# Patient Record
Sex: Male | Born: 1980 | Race: White | Hispanic: No | Marital: Single | Smoking: Current every day smoker
Health system: Southern US, Community
[De-identification: ages and names within clinical notes are randomized; demographics above are authoritative.]

## PROBLEM LIST (undated history)

## (undated) DIAGNOSIS — R112 Nausea with vomiting, unspecified: Secondary | ICD-10-CM

## (undated) DIAGNOSIS — T8859XA Other complications of anesthesia, initial encounter: Secondary | ICD-10-CM

## (undated) DIAGNOSIS — S069X9A Unspecified intracranial injury with loss of consciousness of unspecified duration, initial encounter: Secondary | ICD-10-CM

## (undated) DIAGNOSIS — G4730 Sleep apnea, unspecified: Secondary | ICD-10-CM

## (undated) DIAGNOSIS — F419 Anxiety disorder, unspecified: Secondary | ICD-10-CM

## (undated) DIAGNOSIS — Z9889 Other specified postprocedural states: Secondary | ICD-10-CM

## (undated) DIAGNOSIS — J449 Chronic obstructive pulmonary disease, unspecified: Secondary | ICD-10-CM

## (undated) DIAGNOSIS — Z87442 Personal history of urinary calculi: Secondary | ICD-10-CM

## (undated) DIAGNOSIS — F32A Depression, unspecified: Secondary | ICD-10-CM

## (undated) DIAGNOSIS — M1990 Unspecified osteoarthritis, unspecified site: Secondary | ICD-10-CM

## (undated) DIAGNOSIS — F329 Major depressive disorder, single episode, unspecified: Secondary | ICD-10-CM

## (undated) DIAGNOSIS — T4145XA Adverse effect of unspecified anesthetic, initial encounter: Secondary | ICD-10-CM

## (undated) HISTORY — PX: CARPAL TUNNEL RELEASE: SHX101

## (undated) HISTORY — PX: MULTIPLE TOOTH EXTRACTIONS: SHX2053

---
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
## 2014-04-09 ENCOUNTER — Emergency Department: Payer: Self-pay | Admitting: Emergency Medicine

## 2015-02-08 ENCOUNTER — Other Ambulatory Visit: Payer: Self-pay | Admitting: Neurological Surgery

## 2015-02-13 ENCOUNTER — Encounter (HOSPITAL_COMMUNITY): Payer: Self-pay | Admitting: *Deleted

## 2015-02-13 ENCOUNTER — Encounter (HOSPITAL_COMMUNITY)
Admission: RE | Admit: 2015-02-13 | Discharge: 2015-02-13 | Disposition: A | Discharge status: Home / Self Care | Payer: Medicaid Other | Source: Ambulatory Visit | Attending: Anesthesiology | Admitting: Anesthesiology

## 2015-02-13 ENCOUNTER — Encounter (HOSPITAL_COMMUNITY): Payer: Self-pay

## 2015-02-13 ENCOUNTER — Encounter (HOSPITAL_COMMUNITY)
Admission: RE | Admit: 2015-02-13 | Discharge: 2015-02-13 | Disposition: A | Discharge status: Home / Self Care | Payer: Medicaid Other | Source: Ambulatory Visit | Attending: Neurological Surgery | Admitting: Neurological Surgery

## 2015-02-13 DIAGNOSIS — J449 Chronic obstructive pulmonary disease, unspecified: Secondary | ICD-10-CM | POA: Insufficient documentation

## 2015-02-13 DIAGNOSIS — F17200 Nicotine dependence, unspecified, uncomplicated: Secondary | ICD-10-CM | POA: Diagnosis not present

## 2015-02-13 DIAGNOSIS — Z01812 Encounter for preprocedural laboratory examination: Secondary | ICD-10-CM | POA: Diagnosis not present

## 2015-02-13 DIAGNOSIS — M5020 Other cervical disc displacement, unspecified cervical region: Secondary | ICD-10-CM | POA: Diagnosis not present

## 2015-02-13 DIAGNOSIS — Z01818 Encounter for other preprocedural examination: Secondary | ICD-10-CM | POA: Insufficient documentation

## 2015-02-13 DIAGNOSIS — R05 Cough: Secondary | ICD-10-CM | POA: Diagnosis not present

## 2015-02-13 DIAGNOSIS — R059 Cough, unspecified: Secondary | ICD-10-CM

## 2015-02-13 HISTORY — DX: Chronic obstructive pulmonary disease, unspecified: J44.9

## 2015-02-13 HISTORY — DX: Unspecified intracranial injury with loss of consciousness of unspecified duration, initial encounter: S06.9X9A

## 2015-02-13 HISTORY — DX: Personal history of urinary calculi: Z87.442

## 2015-02-13 HISTORY — DX: Anxiety disorder, unspecified: F41.9

## 2015-02-13 LAB — CBC
HEMATOCRIT: 47.2 % (ref 39.0–52.0)
HEMOGLOBIN: 16.1 g/dL (ref 13.0–17.0)
MCH: 31.3 pg (ref 26.0–34.0)
MCHC: 34.1 g/dL (ref 30.0–36.0)
MCV: 91.8 fL (ref 78.0–100.0)
Platelets: 274 10*3/uL (ref 150–400)
RBC: 5.14 MIL/uL (ref 4.22–5.81)
RDW: 13.3 % (ref 11.5–15.5)
WBC: 10.7 10*3/uL — ABNORMAL HIGH (ref 4.0–10.5)

## 2015-02-13 LAB — SURGICAL PCR SCREEN
MRSA, PCR: NEGATIVE
STAPHYLOCOCCUS AUREUS: NEGATIVE

## 2015-02-13 MED ORDER — CEFAZOLIN SODIUM-DEXTROSE 2-3 GM-% IV SOLR
2.0000 g | INTRAVENOUS | Status: AC
  Administered 2015-02-14: 2 g via INTRAVENOUS
  Filled 2015-02-13: qty 50

## 2015-02-13 NOTE — Progress Notes (Addendum)
Taylor Coffey denies chest pain or shortness of breath.  Patient has a cough, has a history of COPD, is suppose to have an in haler and CPAP but has neither.  Patient's PCP is Dr  In Rosalita Levan.  Patient reported that  He had sleep study at [facility]. I requested sleep study from [facility].  I Dr Len Childs office and left a message for them to call and give their fax number or fax  office note , labs and chest x-rays to our fax number. Taylor Coffey has just completed a course of antibiotics due to a cat bite on face, skin is healed.  Patient reported that he thinks he told Dr Bevely Palmer.

## 2015-02-13 NOTE — Pre-Procedure Instructions (Addendum)
    Avrohom Mckelvin  02/13/2015    Your procedure is scheduled on : Wednesday, January 25.  Report to [facility] Admitting at 6:30A.M.              Your surgery or procedure is scheduled for 8:30 AM   Call this number if you have problems the morning of surgery:[phone]                 Remember:  Do not eat food or drink liquids after midnight.  Take these medicines the morning of surgery with A SIP OF WATER: gabapentin (NEURONTIN).                 STOP taking Meloxicam (Movic).   Do not wear jewelry, make-up or nail polish.  Do not wear lotions, powders, or perfumes.               Men may shave face and neck.  Do not bring valuables to the hospital.  [facility] is not responsible for any belongings or valuables.  Contacts, dentures or bridgework may not be worn into surgery.  Leave your suitcase in the car.  After surgery it may be brought to your room.  For patients admitted to the hospital, discharge time will be determined by your treatment team.  Patients discharged the day of surgery will not be allowed to drive home.   Name and phone number of your driver:   -  Special instructions:  Review  [facility] - Preparing For Surgery.  Please read over the following fact sheets that you were given. Pain Booklet, Coughing and Deep Breathing, MRSA Information and Surgical Site Infection Prevention

## 2015-02-14 ENCOUNTER — Inpatient Hospital Stay (HOSPITAL_COMMUNITY): Payer: Medicaid Other | Admitting: Certified Registered Nurse Anesthetist

## 2015-02-14 ENCOUNTER — Ambulatory Visit (HOSPITAL_COMMUNITY)
Admission: RE | Admit: 2015-02-14 | Discharge: 2015-02-15 | Disposition: A | Discharge status: Home / Self Care | Payer: Medicaid Other | Source: Ambulatory Visit | Attending: Neurological Surgery | Admitting: Neurological Surgery

## 2015-02-14 ENCOUNTER — Encounter (HOSPITAL_COMMUNITY): Payer: Self-pay | Admitting: Certified Registered Nurse Anesthetist

## 2015-02-14 ENCOUNTER — Ambulatory Visit (HOSPITAL_COMMUNITY): Payer: Medicaid Other

## 2015-02-14 ENCOUNTER — Encounter (HOSPITAL_COMMUNITY)
Admission: RE | Disposition: A | Discharge status: Home / Self Care | Payer: Self-pay | Source: Ambulatory Visit | Attending: Neurological Surgery

## 2015-02-14 DIAGNOSIS — J449 Chronic obstructive pulmonary disease, unspecified: Secondary | ICD-10-CM | POA: Diagnosis not present

## 2015-02-14 DIAGNOSIS — F419 Anxiety disorder, unspecified: Secondary | ICD-10-CM | POA: Diagnosis not present

## 2015-02-14 DIAGNOSIS — Z885 Allergy status to narcotic agent status: Secondary | ICD-10-CM | POA: Insufficient documentation

## 2015-02-14 DIAGNOSIS — F17210 Nicotine dependence, cigarettes, uncomplicated: Secondary | ICD-10-CM | POA: Diagnosis not present

## 2015-02-14 DIAGNOSIS — Z880 Allergy status to penicillin: Secondary | ICD-10-CM | POA: Diagnosis not present

## 2015-02-14 DIAGNOSIS — Z419 Encounter for procedure for purposes other than remedying health state, unspecified: Secondary | ICD-10-CM

## 2015-02-14 DIAGNOSIS — M4722 Other spondylosis with radiculopathy, cervical region: Secondary | ICD-10-CM | POA: Insufficient documentation

## 2015-02-14 DIAGNOSIS — Z87442 Personal history of urinary calculi: Secondary | ICD-10-CM | POA: Insufficient documentation

## 2015-02-14 DIAGNOSIS — M50123 Cervical disc disorder at C6-C7 level with radiculopathy: Secondary | ICD-10-CM | POA: Diagnosis not present

## 2015-02-14 HISTORY — DX: Nausea with vomiting, unspecified: R11.2

## 2015-02-14 HISTORY — DX: Adverse effect of unspecified anesthetic, initial encounter: T41.45XA

## 2015-02-14 HISTORY — PX: ANTERIOR CERVICAL DECOMP/DISCECTOMY FUSION: SHX1161

## 2015-02-14 HISTORY — DX: Other complications of anesthesia, initial encounter: T88.59XA

## 2015-02-14 HISTORY — DX: Other specified postprocedural states: Z98.890

## 2015-02-14 SURGERY — ANTERIOR CERVICAL DECOMPRESSION/DISCECTOMY FUSION 2 LEVELS
Anesthesia: General

## 2015-02-14 MED ORDER — LIDOCAINE-EPINEPHRINE 2 %-1:100000 IJ SOLN
INTRAMUSCULAR | Status: DC | PRN
  Administered 2015-02-14: 10 mL via INTRADERMAL

## 2015-02-14 MED ORDER — BISACODYL 5 MG PO TBEC: 5.0000 mg | DELAYED_RELEASE_TABLET | Freq: Every day | ORAL | Status: DC | PRN

## 2015-02-14 MED ORDER — DEXAMETHASONE SODIUM PHOSPHATE 10 MG/ML IJ SOLN
INTRAMUSCULAR | Status: DC | PRN
  Administered 2015-02-14: 10 mg via INTRAVENOUS

## 2015-02-14 MED ORDER — SODIUM CHLORIDE 0.9% FLUSH: 3.0000 mL | INTRAVENOUS | Status: DC | PRN

## 2015-02-14 MED ORDER — ROCURONIUM BROMIDE 100 MG/10ML IV SOLN
INTRAVENOUS | Status: DC | PRN
  Administered 2015-02-14: 50 mg via INTRAVENOUS

## 2015-02-14 MED ORDER — METHOCARBAMOL 500 MG PO TABS
500.0000 mg | ORAL_TABLET | Freq: Four times a day (QID) | ORAL | Status: DC | PRN
  Administered 2015-02-14: 500 mg via ORAL
  Filled 2015-02-14: qty 1

## 2015-02-14 MED ORDER — FLEET ENEMA 7-19 GM/118ML RE ENEM: 1.0000 | ENEMA | Freq: Once | RECTAL | Status: DC | PRN

## 2015-02-14 MED ORDER — NICOTINE 21 MG/24HR TD PT24
21.0000 mg | MEDICATED_PATCH | Freq: Every day | TRANSDERMAL | Status: DC
  Administered 2015-02-14 – 2015-02-15 (×2): 21 mg via TRANSDERMAL
  Filled 2015-02-14 (×2): qty 1

## 2015-02-14 MED ORDER — DOXYCYCLINE HYCLATE 100 MG PO TABS
100.0000 mg | ORAL_TABLET | Freq: Two times a day (BID) | ORAL | Status: DC
  Filled 2015-02-14: qty 1

## 2015-02-14 MED ORDER — ZOLPIDEM TARTRATE 5 MG PO TABS: 5.0000 mg | ORAL_TABLET | Freq: Every evening | ORAL | Status: DC | PRN

## 2015-02-14 MED ORDER — DEXAMETHASONE SODIUM PHOSPHATE 10 MG/ML IJ SOLN
INTRAMUSCULAR | Status: AC
  Filled 2015-02-14: qty 1

## 2015-02-14 MED ORDER — ACETAMINOPHEN 325 MG PO TABS
650.0000 mg | ORAL_TABLET | ORAL | Status: DC | PRN
  Filled 2015-02-14: qty 2

## 2015-02-14 MED ORDER — SODIUM CHLORIDE 0.9 % IJ SOLN
INTRAMUSCULAR | Status: AC
  Filled 2015-02-14: qty 10

## 2015-02-14 MED ORDER — HYDROMORPHONE HCL 1 MG/ML IJ SOLN: 0.5000 mg | INTRAMUSCULAR | Status: DC | PRN

## 2015-02-14 MED ORDER — PROPOFOL 10 MG/ML IV BOLUS
INTRAVENOUS | Status: AC
  Filled 2015-02-14: qty 40

## 2015-02-14 MED ORDER — OXYCODONE-ACETAMINOPHEN 5-325 MG PO TABS
1.0000 | ORAL_TABLET | ORAL | Status: DC | PRN
  Administered 2015-02-14 – 2015-02-15 (×5): 2 via ORAL
  Filled 2015-02-14 (×5): qty 2

## 2015-02-14 MED ORDER — ONDANSETRON HCL 4 MG/2ML IJ SOLN: 4.0000 mg | INTRAMUSCULAR | Status: DC | PRN

## 2015-02-14 MED ORDER — THROMBIN 5000 UNITS EX SOLR
CUTANEOUS | Status: DC | PRN
  Administered 2015-02-14 (×3): 5000 [IU] via TOPICAL

## 2015-02-14 MED ORDER — PHENOL 1.4 % MT LIQD: 1.0000 | OROMUCOSAL | Status: DC | PRN

## 2015-02-14 MED ORDER — LACTATED RINGERS IV SOLN
INTRAVENOUS | Status: DC | PRN
  Administered 2015-02-14: 08:00:00 via INTRAVENOUS

## 2015-02-14 MED ORDER — SENNA 8.6 MG PO TABS
1.0000 | ORAL_TABLET | Freq: Two times a day (BID) | ORAL | Status: DC
  Administered 2015-02-14 – 2015-02-15 (×3): 8.6 mg via ORAL
  Filled 2015-02-14 (×3): qty 1

## 2015-02-14 MED ORDER — GABAPENTIN 600 MG PO TABS
600.0000 mg | ORAL_TABLET | Freq: Four times a day (QID) | ORAL | Status: DC
  Administered 2015-02-14 – 2015-02-15 (×3): 600 mg via ORAL
  Filled 2015-02-14 (×3): qty 1

## 2015-02-14 MED ORDER — BACITRACIN 50000 UNITS IM SOLR
INTRAMUSCULAR | Status: DC | PRN
  Administered 2015-02-14: 08:00:00

## 2015-02-14 MED ORDER — FENTANYL CITRATE (PF) 250 MCG/5ML IJ SOLN
INTRAMUSCULAR | Status: AC
  Filled 2015-02-14: qty 5

## 2015-02-14 MED ORDER — SODIUM CHLORIDE 0.9 % IV SOLN: 250.0000 mL | INTRAVENOUS | Status: DC

## 2015-02-14 MED ORDER — SODIUM CHLORIDE 0.9 % IV SOLN
INTRAVENOUS | Status: DC
Start: 2015-02-14 — End: 2015-02-15

## 2015-02-14 MED ORDER — ONDANSETRON HCL 4 MG/2ML IJ SOLN
INTRAMUSCULAR | Status: DC | PRN
  Administered 2015-02-14: 4 mg via INTRAVENOUS

## 2015-02-14 MED ORDER — GLYCOPYRROLATE 0.2 MG/ML IJ SOLN
INTRAMUSCULAR | Status: DC | PRN
  Administered 2015-02-14: 0.4 mg via INTRAVENOUS

## 2015-02-14 MED ORDER — CEFAZOLIN SODIUM 1-5 GM-% IV SOLN
1.0000 g | Freq: Three times a day (TID) | INTRAVENOUS | Status: AC
  Administered 2015-02-14 – 2015-02-15 (×2): 1 g via INTRAVENOUS
  Filled 2015-02-14 (×2): qty 50

## 2015-02-14 MED ORDER — PHENYLEPHRINE 40 MCG/ML (10ML) SYRINGE FOR IV PUSH (FOR BLOOD PRESSURE SUPPORT)
PREFILLED_SYRINGE | INTRAVENOUS | Status: AC
  Filled 2015-02-14: qty 10

## 2015-02-14 MED ORDER — LIDOCAINE HCL (CARDIAC) 20 MG/ML IV SOLN
INTRAVENOUS | Status: DC | PRN
  Administered 2015-02-14: 100 mg via INTRAVENOUS

## 2015-02-14 MED ORDER — EPHEDRINE SULFATE 50 MG/ML IJ SOLN
INTRAMUSCULAR | Status: AC
  Filled 2015-02-14: qty 1

## 2015-02-14 MED ORDER — THROMBIN 5000 UNITS EX SOLR
CUTANEOUS | Status: DC | PRN
  Administered 2015-02-14: 08:00:00 via TOPICAL

## 2015-02-14 MED ORDER — ROCURONIUM BROMIDE 50 MG/5ML IV SOLN
INTRAVENOUS | Status: AC
  Filled 2015-02-14: qty 1

## 2015-02-14 MED ORDER — HYDROMORPHONE HCL 1 MG/ML IJ SOLN
0.2500 mg | INTRAMUSCULAR | Status: DC | PRN
  Administered 2015-02-14: 0.5 mg via INTRAVENOUS

## 2015-02-14 MED ORDER — DEXTROSE 5 % IV SOLN
500.0000 mg | Freq: Four times a day (QID) | INTRAVENOUS | Status: DC | PRN
  Filled 2015-02-14: qty 5

## 2015-02-14 MED ORDER — BUPIVACAINE-EPINEPHRINE (PF) 0.5% -1:200000 IJ SOLN
INTRAMUSCULAR | Status: DC | PRN
Start: 2015-02-14 — End: 2015-02-14
  Administered 2015-02-14: 10 mL via PERINEURAL

## 2015-02-14 MED ORDER — PROPOFOL 10 MG/ML IV BOLUS
INTRAVENOUS | Status: DC | PRN
  Administered 2015-02-14: 200 mg via INTRAVENOUS

## 2015-02-14 MED ORDER — MIDAZOLAM HCL 2 MG/2ML IJ SOLN
INTRAMUSCULAR | Status: AC
  Filled 2015-02-14: qty 2

## 2015-02-14 MED ORDER — FENTANYL CITRATE (PF) 100 MCG/2ML IJ SOLN
INTRAMUSCULAR | Status: DC | PRN
Start: 2015-02-14 — End: 2015-02-14
  Administered 2015-02-14: 50 ug via INTRAVENOUS
  Administered 2015-02-14: 100 ug via INTRAVENOUS
  Administered 2015-02-14 (×10): 50 ug via INTRAVENOUS
  Administered 2015-02-14: 100 ug via INTRAVENOUS

## 2015-02-14 MED ORDER — SODIUM CHLORIDE 0.9% FLUSH
3.0000 mL | Freq: Two times a day (BID) | INTRAVENOUS | Status: DC
  Administered 2015-02-14: 3 mL via INTRAVENOUS

## 2015-02-14 MED ORDER — LIDOCAINE HCL (CARDIAC) 20 MG/ML IV SOLN
INTRAVENOUS | Status: AC
  Filled 2015-02-14: qty 5

## 2015-02-14 MED ORDER — DOCUSATE SODIUM 100 MG PO CAPS
100.0000 mg | ORAL_CAPSULE | Freq: Two times a day (BID) | ORAL | Status: DC
  Administered 2015-02-14 – 2015-02-15 (×3): 100 mg via ORAL
  Filled 2015-02-14 (×3): qty 1

## 2015-02-14 MED ORDER — MENTHOL 3 MG MT LOZG: 1.0000 | LOZENGE | OROMUCOSAL | Status: DC | PRN

## 2015-02-14 MED ORDER — HYDROMORPHONE HCL 1 MG/ML IJ SOLN
INTRAMUSCULAR | Status: AC
  Filled 2015-02-14: qty 1

## 2015-02-14 MED ORDER — PROMETHAZINE HCL 25 MG/ML IJ SOLN
6.2500 mg | INTRAMUSCULAR | Status: DC | PRN
Start: 2015-02-14 — End: 2015-02-14

## 2015-02-14 MED ORDER — NEOSTIGMINE METHYLSULFATE 10 MG/10ML IV SOLN
INTRAVENOUS | Status: DC | PRN
  Administered 2015-02-14: 3 mg via INTRAVENOUS

## 2015-02-14 MED ORDER — HEMOSTATIC AGENTS (NO CHARGE) OPTIME
TOPICAL | Status: DC | PRN
  Administered 2015-02-14: 1 via TOPICAL

## 2015-02-14 MED ORDER — ACETAMINOPHEN 650 MG RE SUPP: 650.0000 mg | RECTAL | Status: DC | PRN

## 2015-02-14 MED ORDER — MEPERIDINE HCL 25 MG/ML IJ SOLN: 6.2500 mg | INTRAMUSCULAR | Status: DC | PRN

## 2015-02-14 MED ORDER — SUCCINYLCHOLINE CHLORIDE 20 MG/ML IJ SOLN
INTRAMUSCULAR | Status: AC
  Filled 2015-02-14: qty 1

## 2015-02-14 MED ORDER — ONDANSETRON HCL 4 MG/2ML IJ SOLN
INTRAMUSCULAR | Status: AC
  Filled 2015-02-14: qty 2

## 2015-02-14 MED ORDER — 0.9 % SODIUM CHLORIDE (POUR BTL) OPTIME
TOPICAL | Status: DC | PRN
  Administered 2015-02-14: 1000 mL

## 2015-02-14 MED ORDER — MIDAZOLAM HCL 5 MG/5ML IJ SOLN
INTRAMUSCULAR | Status: DC | PRN
  Administered 2015-02-14: 2 mg via INTRAVENOUS

## 2015-02-14 SURGICAL SUPPLY — 68 items
APPLICATOR CHLORAPREP 3ML ORNG (MISCELLANEOUS) ×3 IMPLANT
BIT DRILL NEURO 2X3.1 SFT TUCH (MISCELLANEOUS) ×1 IMPLANT
BIT DRILL POWER (BIT) ×1 IMPLANT
BLADE SURG 11 STRL SS (BLADE) ×3 IMPLANT
BLADE ULTRA TIP 2M (BLADE) IMPLANT
BONE MATRIX OSTEOCEL PRO SM (Bone Implant) ×3 IMPLANT
BUR MATCHSTICK NEURO 3.0 LAGG (BURR) ×3 IMPLANT
CAGE SMALL 7X13X15 (Cage) ×6 IMPLANT
CANISTER SUCT 3000ML PPV (MISCELLANEOUS) ×3 IMPLANT
DECANTER SPIKE VIAL GLASS SM (MISCELLANEOUS) ×3 IMPLANT
DERMABOND ADVANCED (GAUZE/BANDAGES/DRESSINGS) ×2
DERMABOND ADVANCED .7 DNX12 (GAUZE/BANDAGES/DRESSINGS) ×1 IMPLANT
DRAPE C-ARM 42X72 X-RAY (DRAPES) ×3 IMPLANT
DRAPE C-ARMOR (DRAPES) ×6 IMPLANT
DRAPE LAPAROTOMY 100X72 PEDS (DRAPES) ×3 IMPLANT
DRAPE MICROSCOPE LEICA (MISCELLANEOUS) ×3 IMPLANT
DRAPE POUCH INSTRU U-SHP 10X18 (DRAPES) ×3 IMPLANT
DRAPE PROXIMA HALF (DRAPES) IMPLANT
DRAPE SHEET LG 3/4 BI-LAMINATE (DRAPES) ×3 IMPLANT
DRILL BIT POWER (BIT) ×2
DRILL NEURO 2X3.1 SOFT TOUCH (MISCELLANEOUS) ×3
ELECT COATED BLADE 2.86 ST (ELECTRODE) ×3 IMPLANT
ELECT REM PT RETURN 9FT ADLT (ELECTROSURGICAL) ×3
ELECTRODE REM PT RTRN 9FT ADLT (ELECTROSURGICAL) ×1 IMPLANT
EVACUATOR 1/8 PVC DRAIN (DRAIN) IMPLANT
GAUZE SPONGE 4X4 12PLY STRL (GAUZE/BANDAGES/DRESSINGS) ×3 IMPLANT
GAUZE SPONGE 4X4 16PLY XRAY LF (GAUZE/BANDAGES/DRESSINGS) IMPLANT
GLOVE BIO SURGEON STRL SZ7 (GLOVE) ×9 IMPLANT
GLOVE BIO SURGEON STRL SZ8 (GLOVE) ×3 IMPLANT
GLOVE BIOGEL PI IND STRL 7.5 (GLOVE) ×1 IMPLANT
GLOVE BIOGEL PI INDICATOR 7.5 (GLOVE) ×2
GLOVE EXAM NITRILE LRG STRL (GLOVE) IMPLANT
GLOVE INDICATOR 7.0 STRL GRN (GLOVE) ×12 IMPLANT
GLOVE INDICATOR 7.5 STRL GRN (GLOVE) ×6 IMPLANT
GLOVE SS BIOGEL STRL SZ 7 (GLOVE) ×3 IMPLANT
GLOVE SUPERSENSE BIOGEL SZ 7 (GLOVE) ×6
GLOVE SURG SS PI 6.5 STRL IVOR (GLOVE) ×9 IMPLANT
GOWN STRL REUS W/ TWL LRG LVL3 (GOWN DISPOSABLE) ×4 IMPLANT
GOWN STRL REUS W/ TWL XL LVL3 (GOWN DISPOSABLE) ×1 IMPLANT
GOWN STRL REUS W/TWL LRG LVL3 (GOWN DISPOSABLE) ×8
GOWN STRL REUS W/TWL XL LVL3 (GOWN DISPOSABLE) ×2
HEMOSTAT POWDER KIT SURGIFOAM (HEMOSTASIS) ×3 IMPLANT
KIT BASIN OR (CUSTOM PROCEDURE TRAY) ×3 IMPLANT
KIT ROOM TURNOVER OR (KITS) ×3 IMPLANT
NEEDLE HYPO 25X1 1.5 SAFETY (NEEDLE) ×3 IMPLANT
NEEDLE SPNL 20GX3.5 QUINCKE YW (NEEDLE) ×3 IMPLANT
NEEDLE SPNL 22GX3.5 QUINCKE BK (NEEDLE) ×3 IMPLANT
NS IRRIG 1000ML POUR BTL (IV SOLUTION) ×3 IMPLANT
PACK LAMINECTOMY NEURO (CUSTOM PROCEDURE TRAY) ×3 IMPLANT
PAD ARMBOARD 7.5X6 YLW CONV (MISCELLANEOUS) ×3 IMPLANT
PATTIES SURGICAL .5X1.5 (GAUZE/BANDAGES/DRESSINGS) ×3 IMPLANT
PLATE ARCHON 42MM 2LVL (Plate) ×3 IMPLANT
RUBBERBAND STERILE (MISCELLANEOUS) ×6 IMPLANT
SCREW ARCHON SELFTAP 4.0X13 (Screw) ×6 IMPLANT
SCREW ARCHON SELFTAP 4.0X15MM (Screw) ×6 IMPLANT
SCREW ARCHON ST VAR 4.0X15MM (Screw) ×4 IMPLANT
SCREW BN 15X4XST VA NS SPN (Screw) ×2 IMPLANT
SPONGE INTESTINAL PEANUT (DISPOSABLE) ×6 IMPLANT
SPONGE SURGIFOAM ABS GEL SZ50 (HEMOSTASIS) ×3 IMPLANT
STAPLER VISISTAT 35W (STAPLE) ×3 IMPLANT
STOCKINETTE 6  STRL (DRAPES) ×2
STOCKINETTE 6 STRL (DRAPES) ×1 IMPLANT
SUT VIC AB 3-0 SH 8-18 (SUTURE) ×3 IMPLANT
TOWEL OR 17X24 6PK STRL BLUE (TOWEL DISPOSABLE) ×6 IMPLANT
TOWEL OR 17X26 10 PK STRL BLUE (TOWEL DISPOSABLE) ×3 IMPLANT
TUBE CONNECTING 12'X1/4 (SUCTIONS)
TUBE CONNECTING 12X1/4 (SUCTIONS) IMPLANT
WATER STERILE IRR 1000ML POUR (IV SOLUTION) ×3 IMPLANT

## 2015-02-14 NOTE — Op Note (Signed)
02/14/2015  11:50 AM  PATIENT:  Taylor Coffey  35 y.o. male  PRE-OPERATIVE DIAGNOSIS:  Cervical spondylosis with radiculopathy, C5-6, C6-7, herniated nucleus pulposis C6-7  POST-OPERATIVE DIAGNOSIS:  Same  PROCEDURE:  C5-6, C6-7 ACDF  SURGEON:  Hulan Saas, MD  ASSISTANTS: Marikay Alar, MD  ANESTHESIA:   General  DRAINS: Medium Hemovac  SPECIMEN:  None  INDICATION FOR PROCEDURE: 35 year old man with medically refractory neck and arm pain with cervical spondylosis C5-6, C6-7. Patient understood the risks, benefits, and alternatives and potential outcomes and wished to proceed.  PROCEDURE DETAILS: Patient was brought to the operating room placed under general endotracheal anesthesia. Patient was placed in the supine position on the operating room table. The neck was prepped with betadine and chloraprep and draped in a sterile fashion.   A transverse incision was made on the right side of the neck. Dissection was carried down thru the subcutaneous tissue and the platysma was elevated, opened vertically, and undermined with Metzenbaum scissors. Dissection was then carried out thru an avascular plane leaving the sternocleidomastoid, carotid artery, and jugular vein laterally and the trachea and esophagus medially. The ventral aspect of the vertebral column was identified and a localizing x-ray was taken. The C5-6 level was identified. The longus colli muscles were then elevated and the retractor was placed. The annulus was incised and the disc space entered. Discectomy was performed with micro-curettes and pituitary and kerrison rongeurs. I then used the high-speed drill to drill the endplates down to the level of the posterior longitudinal ligament. The operating microscope was draped and brought into the field provided additional magnification, illumination and visualization. Utilizing microsurgical technique, discectomy was continued posteriorly thru the disc space. Posterior  longitudinal ligament was opened with a nerve hook, and then removed along with disc herniation and osteophytes, decompressing the spinal canal and thecal sac. We then continued to remove osteophytic overgrowth and disc material decompressing the neural foramina and exiting nerve roots bilaterally. So by both visualization and palpation we felt we had an adequate decompression of the neural elements. We then measured the height of the intravertebral disc space and selected a 6 millimeter Peek interbody cage packed with morcellized allograft. It was then gently positioned in the intravertebral disc space and countersunk.   The distraction pin was removed from the C5 vertebral body and bone bleeding was stopped with bone wax. The distraction pin was inserted at the level of C7. The annulus was incised and the disc space entered. Discectomy was performed with micro-curettes and pituitary and kerrison rongeurs. I then used the high-speed drill to drill the endplates down to the level of the posterior longitudinal ligament. The operating microscope was draped and brought into the field provided additional magnification, illumination and visualization. Utilizing microsurgical technique, discectomy was continued posteriorly thru the disc space. Posterior longitudinal ligament was opened with a nerve hook, and then removed along with disc herniation and osteophytes, decompressing the spinal canal and thecal sac. We then continued to remove osteophytic overgrowth and disc material decompressing the neural foramina and exiting nerve roots bilaterally. So by both visualization and palpation we felt we had an adequate decompression of the neural elements. We then measured the height of the intravertebral disc space and selected a 8 millimeter Peek interbody cage packed with morcellized allograft. It was then gently positioned in the intravertebral disc space and countersunk.   I then used a 40 mm plate and placed four  variable angle screws into the C5 and C6  vertebral bodies and two fixed angle screws at C7 and locked them into position. The wound was irrigated with bacitracin solution, checked for hemostasis which was established and confirmed. Once meticulous hemostasis was achieved, we then proceeded with closure. The platysma was closed with interrupted 3-0 undyed Vicryl suture, the subcuticular layer was closed with interrupted 3-0 undyed Vicryl suture. The skin edges were approximated with dermabond. The drapes were removed. A sterile dressing was applied. The patient was then awakened from general anesthesia and transferred to the recovery room in stable condition. At the end of the procedure all sponge, needle and instrument counts were correct.  PATIENT DISPOSITION:  PACU then floor   Delay start of Pharmacological VTE agent (>24hrs) due to surgical blood loss or risk of bleeding:  Yes

## 2015-02-14 NOTE — Transfer of Care (Signed)
Immediate Anesthesia Transfer of Care Note  Patient: Taylor Coffey  Procedure(s) Performed: Procedure(s): Cervical five-six cervical six-seven  Anterior cervical decompression/diskectomy/fusion (N/A)  Patient Location: PACU  Anesthesia Type:General  Level of Consciousness: awake, alert , oriented and patient cooperative  Airway & Oxygen Therapy: Patient Spontanous Breathing and Patient connected to nasal cannula oxygen  Post-op Assessment: Report given to RN and Post -op Vital signs reviewed and stable  Post vital signs: Reviewed and stable  Last Vitals:  Filed Vitals:   02/14/15 0712  BP: 116/75  Pulse: 74  Temp: 36.2 C  Resp: 18    Complications: No apparent anesthesia complications

## 2015-02-14 NOTE — Anesthesia Postprocedure Evaluation (Signed)
Anesthesia Post Note  Patient: Taylor Coffey  Procedure(s) Performed: Procedure(s) (LRB): Cervical five-six cervical six-seven  Anterior cervical decompression/diskectomy/fusion (N/A)  Patient location during evaluation: PACU Anesthesia Type: General Level of consciousness: sedated and patient cooperative Pain management: pain level controlled Vital Signs Assessment: post-procedure vital signs reviewed and stable Respiratory status: spontaneous breathing Cardiovascular status: stable Anesthetic complications: no    Last Vitals:  Filed Vitals:   02/14/15 1356 02/14/15 1400  BP:  132/74  Pulse: 100 103  Temp:    Resp: 10 15    Last Pain:  Filed Vitals:   02/14/15 1404  PainSc: Asleep                 Lewie Loron

## 2015-02-14 NOTE — Anesthesia Procedure Notes (Signed)
Procedure Name: Intubation Date/Time: 02/14/2015 8:46 AM Performed by: Faustino Congress Dodge Ator Pre-anesthesia Checklist: Patient identified, Emergency Drugs available, Suction available and Patient being monitored Patient Re-evaluated:Patient Re-evaluated prior to inductionOxygen Delivery Method: Circle system utilized Preoxygenation: Pre-oxygenation with 100% oxygen Intubation Type: IV induction Ventilation: Mask ventilation without difficulty Laryngoscope Size: Mac and 3 Grade View: Grade I Tube type: Oral Tube size: 8.0 mm Number of attempts: 1 Airway Equipment and Method: Stylet Placement Confirmation: ETT inserted through vocal cords under direct vision,  positive ETCO2 and breath sounds checked- equal and bilateral Secured at: 23 cm Tube secured with: Tape Dental Injury: Teeth and Oropharynx as per pre-operative assessment

## 2015-02-14 NOTE — Progress Notes (Signed)
Sleeping now Has been moving all extremities well Incision c/d/i and flat To floor

## 2015-02-14 NOTE — H&P (Signed)
CC:  Neck and arm pain refractory to medical management  HPI: Taylor Coffey is a 35 y.o. male with neck and right arm pain refractory to medical management.  He presents today for a C5-6, 6-7 ACDF.  We have discussed the risks and benefits and he wishes to proceed.  PMH: Past Medical History  Diagnosis Date  . Complication of anesthesia 2011ish    Woke up with headache, and nausea  . PONV (postoperative nausea and vomiting)     nausea  . Head injury with loss of consciousness (HCC)   . COPD (chronic obstructive pulmonary disease) (HCC)   . Anxiety     with crowds of people  . History of kidney stones     PSH: Past Surgical History  Procedure Laterality Date  . Multiple tooth extractions      SH: Social History  Substance Use Topics  . Smoking status: Current Every Day Smoker -- 1.50 packs/day for 20 years  . Smokeless tobacco: None  . Alcohol Use: No    MEDS: Prior to Admission medications   Medication Sig Start Date End Date Taking? Authorizing Provider  doxycycline (VIBRA-TABS) 100 MG tablet Take 100 mg by mouth 2 (two) times daily.   Yes Historical Provider, MD  gabapentin (NEURONTIN) 600 MG tablet Take 600 mg by mouth 4 (four) times daily. 12/22/14  Yes Historical Provider, MD  HYDROcodone-acetaminophen (NORCO) 7.5-325 MG tablet Take 1 tablet by mouth every 6 (six) hours as needed for moderate pain (Pat takes 2 in the am and 2 at [facility]   Yes Historical Provider, MD  meloxicam (MOBIC) 7.5 MG tablet Take 7.5 mg by mouth daily. 01/24/15  Yes Historical Provider, MD    ALLERGY: Allergies  Allergen Reactions  . Vicodin [Hydrocodone-Acetaminophen] Itching  . Amoxicillin Itching and Rash    ROS: ROS  NEUROLOGIC EXAM: Awake, alert, oriented Memory and concentration grossly intact Speech fluent, appropriate CN grossly intact Motor exam: Upper Extremities Deltoid Bicep Tricep Grip  Right 5/5 4/5 4/5 5/5  Left 5/5 5/5 5/5 5/5   Lower Extremity IP Quad PF DF  EHL  Right 5/5 5/5 5/5 5/5 5/5  Left 5/5 5/5 5/5 5/5 5/5   Sensation grossly intact to LT  IMAGING: No new imaging  IMPRESSION: - 35 y.o. male with cervical spondylosis with radiculopathy.  Deficits as above.  PLAN: - C5-6, 6-7 ACDF

## 2015-02-14 NOTE — Anesthesia Preprocedure Evaluation (Addendum)
Anesthesia Evaluation  Patient identified by MRN, date of birth, ID band Patient awake    Reviewed: Allergy & Precautions, NPO status , Patient's Chart, lab work & pertinent test results  History of Anesthesia Complications (+) PONV and history of anesthetic complications  Airway Mallampati: II  TM Distance: >3 FB Neck ROM: Full    Dental no notable dental hx. (+) Dental Advisory Given   Pulmonary COPD, Current Smoker,    Pulmonary exam normal breath sounds clear to auscultation       Cardiovascular negative cardio ROS Normal cardiovascular exam Rhythm:Regular Rate:Normal     Neuro/Psych Anxiety negative neurological ROS  negative psych ROS   GI/Hepatic negative GI ROS, Neg liver ROS,   Endo/Other  negative endocrine ROS  Renal/GU negative Renal ROS     Musculoskeletal negative musculoskeletal ROS (+)   Abdominal   Peds  Hematology negative hematology ROS (+)   Anesthesia Other Findings   Reproductive/Obstetrics                            Anesthesia Physical Anesthesia Plan  ASA: III  Anesthesia Plan: General   Post-op Pain Management:    Induction: Intravenous  Airway Management Planned: Oral ETT  Additional Equipment:   Intra-op Plan:   Post-operative Plan: Extubation in OR  Informed Consent: I have reviewed the patients History and Physical, chart, labs and discussed the procedure including the risks, benefits and alternatives for the proposed anesthesia with the patient or authorized representative who has indicated his/her understanding and acceptance.   Dental advisory given  Plan Discussed with: CRNA  Anesthesia Plan Comments:         Anesthesia Quick Evaluation

## 2015-02-15 ENCOUNTER — Encounter (HOSPITAL_COMMUNITY): Payer: Self-pay | Admitting: Neurological Surgery

## 2015-02-15 DIAGNOSIS — M50123 Cervical disc disorder at C6-C7 level with radiculopathy: Secondary | ICD-10-CM | POA: Diagnosis not present

## 2015-02-15 MED ORDER — METHOCARBAMOL 750 MG PO TABS: 750.0000 mg | ORAL_TABLET | Freq: Four times a day (QID) | ORAL | Status: DC

## 2015-02-15 MED ORDER — OXYCODONE-ACETAMINOPHEN 5-325 MG PO TABS: 1.0000 | ORAL_TABLET | ORAL | Status: DC | PRN

## 2015-02-15 NOTE — Progress Notes (Signed)
Pt seen and examined. No issues overnight.  EXAM: Temp:  [97.6 F (36.4 C)-98.1 F (36.7 C)] 98 F (36.7 C) (01/26 0841) Pulse Rate:  [74-122] 74 (01/26 0841) Resp:  [10-20] 20 (01/26 0841) BP: (112-142)/(52-88) 112/85 mmHg (01/26 0841) SpO2:  [93 %-99 %] 99 % (01/26 0841) Intake/Output      01/25 0701 - 01/26 0700 01/26 0701 - 01/27 0700   P.O. 360    I.V. 1300    Total Intake 1660     Urine 0    Drains 35    Blood 75    Total Output 110     Net +1550          Urine Occurrence 3 x     Awake and alert Follows commands throughout Full strength  Stable D/c

## 2015-02-15 NOTE — Progress Notes (Signed)
Pt doing well. Pt and friend given D/C instructions with Rx, verbal understanding was provided. Pt's incision is clean and dry with no sign of infection. Pt's IV and Hemovac were removed prior to D/C. Pt D/C'd home via walking @ 0950 per MD order. Pt is stable @ D/C and has no other needs at this time. Rema Fendt, RN

## 2015-02-15 NOTE — Discharge Summary (Signed)
Expand All Collapse All   Date of admission: 02/14/15  Date of discharge: 02/15/15  Admission diagnosis: Cervical spondylosis with radiculopathy  Discharge diagnosis: Same  Procedure performed: C5-6 and C6-7 ACDF with plating  Hospital course: Taylor Coffey was admitted to the hospital morning of surgery and taken the operating room for the above listed operation. He tolerated this well was stable and discharged from the operating room to the PACU and then to the floor. On the morning of postoperative day 1 he was doing well he was ambulating independently tolerating regular diet and his pain was well-controlled. He is discharged this time stable medical neurological condition.  Discharge medications: He will resume his prior medications  Follow-up: With me in two weeks

## 2015-02-19 ENCOUNTER — Encounter: Payer: Medicaid Other | Admitting: Neurology

## 2015-03-21 ENCOUNTER — Encounter: Payer: Medicaid Other | Admitting: Neurology

## 2015-03-21 ENCOUNTER — Telehealth: Payer: Self-pay | Admitting: Neurology

## 2015-03-21 NOTE — Telephone Encounter (Signed)
This patient did not show for an EMG and nerve conduction study appointment today. 

## 2015-03-22 ENCOUNTER — Encounter: Payer: Self-pay | Admitting: Neurology

## 2016-02-27 ENCOUNTER — Encounter: Payer: Self-pay | Admitting: *Deleted

## 2016-02-27 ENCOUNTER — Ambulatory Visit: Payer: Medicaid Other | Attending: Neurological Surgery | Admitting: *Deleted

## 2016-02-27 DIAGNOSIS — G5603 Carpal tunnel syndrome, bilateral upper limbs: Secondary | ICD-10-CM | POA: Diagnosis present

## 2016-02-27 DIAGNOSIS — M79641 Pain in right hand: Secondary | ICD-10-CM | POA: Insufficient documentation

## 2016-02-27 DIAGNOSIS — M79642 Pain in left hand: Secondary | ICD-10-CM | POA: Insufficient documentation

## 2016-02-27 NOTE — Therapy (Signed)
[facility] [address] [place], [place], [address] Phone: [phone]   Fax:  [phone]  Occupational Therapy Evaluation  Patient Details  Name: Taylor Coffey MRN: [id number] Date of Birth: 01/10/1981 Referring Provider: Hulan Saas, MD  Encounter Date: 02/27/2016      OT End of Session - 02/27/16 1150    Visit Number 1   Number of Visits 1   Authorization Type MCD Eval only   Authorization - Visit Number 1   Authorization - Number of Visits 1   OT Start Time [phone]   OT Stop Time 1006   OT Time Calculation (min) 44 min   Activity Tolerance Patient tolerated treatment well;No increased pain   Behavior During Therapy WFL for tasks assessed/performed      Past Medical History:  Diagnosis Date  . Anxiety    with crowds of people  . Complication of anesthesia 2011ish   Woke up with headache, and nausea  . COPD (chronic obstructive pulmonary disease) (HCC)   . Head injury with loss of consciousness (HCC)   . History of kidney stones   . PONV (postoperative nausea and vomiting)    nausea    Past Surgical History:  Procedure Laterality Date  . ANTERIOR CERVICAL DECOMP/DISCECTOMY FUSION N/A 02/14/2015   Procedure: Cervical five-six cervical six-seven  Anterior cervical decompression/diskectomy/fusion;  Surgeon: Loura Halt Ditty, MD;  Location: [facility];  Service: Neurosurgery;  Laterality: N/A;  . MULTIPLE TOOTH EXTRACTIONS      There were no vitals filed for this visit.      Subjective Assessment - 02/27/16 0927    Subjective  Pt states that he had Left CTR on 01/02/16, he also has CTS R hand and may need surgery in the near future - possibly Feb. 2018.    Patient is accompained by: Family member  Significant other   Repetition Increases Symptoms  Works on [employer] - gets worse after working   Patient Stated Goals Dercreased pain in left hand and digits 1-3.    Currently in Pain? 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  No   Is the patient reluctant to leave their home because of a fear of falling?  No     Home  Environment   Family/patient expects to be discharged to: Private residence   Lives With Significant other     Prior Function   Level of Independence Independent   Vocation Unemployed   Vocation Requirements Works on [employer], cars - currently  not able to work much due to symptoms and pain. Difficulty holding onto tools and use/complete jobs.  Only works when able - [employer] since 2015     Written Expression   Dominant Hand Right     Activity Tolerance   Activity Tolerance Comments Decreased ability to perform work related tasks due to dropping tools and taking longer to complete jobs.     Cognition   Overall Cognitive Status Within Functional Limits for tasks assessed     Observation/Other Assessments   Other Surveys  Select   Quick DASH  50     Sensation   Light Touch Appears Intact   Additional Comments Intact to light touch bilateral hands, digits 1-5 noted.     Coordination   9 Hole Peg Test Right;Left   Right 9 Hole Peg Test 27.91sec   Left 9 Hole Peg Test 25.50 sec   Other Pt is with noted scar  hypersensitivity along left volar wrist s/p CTR on 01/02/16. He reports that sensitivity is getting better over time. He was educated in desensitization techniques and scar management/massage. Pt was positive Tinels along carpal tunnel and Phalens testing for provocative testing on R & L. Symptoms on right were greater than left. "I think that left is getting better"      Edema   Edema --  None observed visually when comparing R & L hands     Hand Function   Right Hand Grip (lbs) 76#   Right Hand Lateral Pinch 17 lbs   Right Hand 3 Point Pinch 18 lbs   Left Hand Grip (lbs) 66#   Left Hand Lateral Pinch 19 lbs   Left 3 point pinch 22.5 lbs     Sensation Exercises   Desensitization Pt is sensitive along left wrist volar scar - he was instructed in home program for densesitization techniques.                  OT Treatments/Exercises (OP) - 02/27/16 0001      ADLs   Overall ADLs Pt was educated in neutral wrist techniques for dialy activities. He was educated to avoid sustained grip and repetetive motion. Exampes were provided and neutral wrist was demonstrated in clinic today.      Exercises   Exercises Hand     Hand Exercises   Tendon Glides Pt was instructed in gentle tendon gliding bilateral hands   Other Hand Exercises Pt was instructed in median nerve gliding techniques bilateral UE's. He performed in clinis after instruction/demonstration. Handout issued.   Other Hand Exercises Pt was eduated to perform forearm flexor stretches bilateral UE's, first with elbow flexed and then extended as tolerated.     Splinting   Splinting Pt was issued pre-fab wrist cock-up splints bilaterally and instructed to only wear at noc as he reports waking up at night with CTS symptoms, paresthesias etc, he also states that he tends to sleep with his wrists tucked/flexed. Splinting use, care and precautions were all reviewed with pt/significant other.                OT Education -  02/27/16 1148    Education provided Yes   Education Details Neutral wrist positioning for functional activity. Avoidance of repetetive motion and sustained grip. Tendon gliding, median nerve gliding ex's, forearm flexor stretches (with elbow flexed then extended as tolerated), scar management left volar wrist. Pre-fab splint use at noc only.   Person(s) Educated Patient;Spouse   Methods Explanation;Demonstration;Verbal cues;Handout  Comprehension Verbalized understanding;Returned demonstration          OT Short Term Goals - 02/27/16 1159      OT SHORT TERM GOAL #1   Title Eval only: Pt verbalized understanding of home program and noc pre-fab splint use, care and precautions in clinic today. He denies further OT needs at this time.   Status Achieved           OT Long Term Goals - 02/27/16 1200      OT LONG TERM GOAL #1   Title Eval only               Plan - 02/27/16 1151    Clinical Impression Statement Pt is a 36 y/o male with dx: Bilateral CTS, he underwent CTR left on 01/02/16 and cont to experience some symptoms of scar hypersensitivity and paresthesias at times. He was instructed in a home program of conservative management for CTS R UE, noc splinting PRN bilaterally, desensitization of left scar volar palm & neutral wrist for functional activity. He verbalized understanding of this treatment plan and home program and will cont independently. He should benefit from f/u with his MD PRN.   Rehab Potential --  Eval only   OT Frequency One time visit   Plan Eval only   Consulted and Agree with Plan of Care Patient;Family member/caregiver   Family Member Consulted Significant other/spouse      Patient will benefit from skilled therapeutic intervention in order to improve the following deficits and impairments:     Visit Diagnosis: Pain in right hand - Plan: Ot plan of care cert/re-cert  Pain in left hand - Plan: Ot plan of care cert/re-cert  Carpal tunnel  syndrome, bilateral upper limbs - Plan: Ot plan of care cert/re-cert    Problem List Patient Active Problem List   Diagnosis Date Noted  . Cervical spondylosis with radiculopathy 02/14/2015    Mariam Dollar Dionicio Stall, OTR/L 02/27/2016, 12:06 PM  [facility] [address] [place], [place], [address] Phone: [phone]   Fax:  [phone]  Name: Tina Temme MRN: [id number] Date of Birth: Jan 24, 1980

## 2016-08-19 ENCOUNTER — Other Ambulatory Visit: Payer: Self-pay | Admitting: Physician Assistant

## 2016-08-19 DIAGNOSIS — M5412 Radiculopathy, cervical region: Secondary | ICD-10-CM

## 2016-09-02 ENCOUNTER — Ambulatory Visit
Admission: RE | Admit: 2016-09-02 | Discharge: 2016-09-02 | Disposition: A | Discharge status: Home / Self Care | Payer: Medicaid Other | Source: Ambulatory Visit | Attending: Physician Assistant | Admitting: Physician Assistant

## 2016-09-02 DIAGNOSIS — M5412 Radiculopathy, cervical region: Secondary | ICD-10-CM

## 2016-09-02 MED ORDER — GADOBENATE DIMEGLUMINE 529 MG/ML IV SOLN
20.0000 mL | Freq: Once | INTRAVENOUS | Status: AC | PRN
  Administered 2016-09-02: 20 mL via INTRAVENOUS

## 2017-03-30 DIAGNOSIS — F17200 Nicotine dependence, unspecified, uncomplicated: Secondary | ICD-10-CM | POA: Diagnosis not present

## 2017-03-30 DIAGNOSIS — Z0000 Encounter for general adult medical examination without abnormal findings: Secondary | ICD-10-CM | POA: Diagnosis not present

## 2017-03-30 DIAGNOSIS — Z6836 Body mass index (BMI) 36.0-36.9, adult: Secondary | ICD-10-CM | POA: Diagnosis not present

## 2017-03-30 DIAGNOSIS — E669 Obesity, unspecified: Secondary | ICD-10-CM | POA: Diagnosis not present

## 2017-05-19 DIAGNOSIS — M542 Cervicalgia: Secondary | ICD-10-CM | POA: Diagnosis not present

## 2017-05-19 DIAGNOSIS — R200 Anesthesia of skin: Secondary | ICD-10-CM | POA: Diagnosis not present

## 2017-05-19 DIAGNOSIS — M25511 Pain in right shoulder: Secondary | ICD-10-CM | POA: Diagnosis not present

## 2017-05-19 DIAGNOSIS — Z6837 Body mass index (BMI) 37.0-37.9, adult: Secondary | ICD-10-CM | POA: Diagnosis not present

## 2017-08-15 DIAGNOSIS — F17210 Nicotine dependence, cigarettes, uncomplicated: Secondary | ICD-10-CM | POA: Diagnosis not present

## 2017-08-15 DIAGNOSIS — Z981 Arthrodesis status: Secondary | ICD-10-CM | POA: Diagnosis not present

## 2017-08-15 DIAGNOSIS — M542 Cervicalgia: Secondary | ICD-10-CM | POA: Diagnosis not present

## 2017-08-15 DIAGNOSIS — G4733 Obstructive sleep apnea (adult) (pediatric): Secondary | ICD-10-CM | POA: Diagnosis not present

## 2017-08-15 DIAGNOSIS — G8929 Other chronic pain: Secondary | ICD-10-CM | POA: Diagnosis not present

## 2017-09-22 DIAGNOSIS — J449 Chronic obstructive pulmonary disease, unspecified: Secondary | ICD-10-CM | POA: Diagnosis not present

## 2017-09-22 DIAGNOSIS — R599 Enlarged lymph nodes, unspecified: Secondary | ICD-10-CM | POA: Diagnosis not present

## 2017-12-28 DIAGNOSIS — M549 Dorsalgia, unspecified: Secondary | ICD-10-CM | POA: Diagnosis not present

## 2017-12-28 DIAGNOSIS — G4733 Obstructive sleep apnea (adult) (pediatric): Secondary | ICD-10-CM | POA: Diagnosis not present

## 2017-12-28 DIAGNOSIS — G8929 Other chronic pain: Secondary | ICD-10-CM | POA: Diagnosis not present

## 2017-12-28 DIAGNOSIS — M542 Cervicalgia: Secondary | ICD-10-CM | POA: Diagnosis not present

## 2017-12-28 DIAGNOSIS — M5020 Other cervical disc displacement, unspecified cervical region: Secondary | ICD-10-CM | POA: Diagnosis not present

## 2017-12-28 DIAGNOSIS — Z981 Arthrodesis status: Secondary | ICD-10-CM | POA: Diagnosis not present

## 2017-12-28 DIAGNOSIS — R413 Other amnesia: Secondary | ICD-10-CM | POA: Diagnosis not present

## 2017-12-28 DIAGNOSIS — M5412 Radiculopathy, cervical region: Secondary | ICD-10-CM | POA: Diagnosis not present

## 2017-12-28 DIAGNOSIS — G4700 Insomnia, unspecified: Secondary | ICD-10-CM | POA: Diagnosis not present

## 2017-12-28 DIAGNOSIS — M25511 Pain in right shoulder: Secondary | ICD-10-CM | POA: Diagnosis not present

## 2018-04-01 DIAGNOSIS — J449 Chronic obstructive pulmonary disease, unspecified: Secondary | ICD-10-CM | POA: Diagnosis not present

## 2018-04-01 DIAGNOSIS — E782 Mixed hyperlipidemia: Secondary | ICD-10-CM | POA: Diagnosis not present

## 2018-04-01 DIAGNOSIS — Z125 Encounter for screening for malignant neoplasm of prostate: Secondary | ICD-10-CM | POA: Diagnosis not present

## 2018-04-01 DIAGNOSIS — Z0000 Encounter for general adult medical examination without abnormal findings: Secondary | ICD-10-CM | POA: Diagnosis not present

## 2018-04-01 DIAGNOSIS — F17200 Nicotine dependence, unspecified, uncomplicated: Secondary | ICD-10-CM | POA: Diagnosis not present

## 2018-04-01 DIAGNOSIS — R5383 Other fatigue: Secondary | ICD-10-CM | POA: Diagnosis not present

## 2018-04-01 DIAGNOSIS — E669 Obesity, unspecified: Secondary | ICD-10-CM | POA: Diagnosis not present

## 2018-04-01 DIAGNOSIS — Z6837 Body mass index (BMI) 37.0-37.9, adult: Secondary | ICD-10-CM | POA: Diagnosis not present

## 2018-12-30 DIAGNOSIS — Z2821 Immunization not carried out because of patient refusal: Secondary | ICD-10-CM | POA: Diagnosis not present

## 2018-12-30 DIAGNOSIS — G4709 Other insomnia: Secondary | ICD-10-CM | POA: Diagnosis not present

## 2018-12-30 DIAGNOSIS — F341 Dysthymic disorder: Secondary | ICD-10-CM | POA: Diagnosis not present

## 2018-12-30 DIAGNOSIS — E782 Mixed hyperlipidemia: Secondary | ICD-10-CM | POA: Diagnosis not present

## 2019-01-27 DIAGNOSIS — M4722 Other spondylosis with radiculopathy, cervical region: Secondary | ICD-10-CM | POA: Diagnosis not present

## 2019-01-27 DIAGNOSIS — M5412 Radiculopathy, cervical region: Secondary | ICD-10-CM | POA: Diagnosis not present

## 2019-02-01 DIAGNOSIS — M542 Cervicalgia: Secondary | ICD-10-CM | POA: Diagnosis not present

## 2019-02-01 DIAGNOSIS — M5412 Radiculopathy, cervical region: Secondary | ICD-10-CM | POA: Diagnosis not present

## 2019-02-09 DIAGNOSIS — M4712 Other spondylosis with myelopathy, cervical region: Secondary | ICD-10-CM | POA: Diagnosis not present

## 2019-02-09 DIAGNOSIS — M4802 Spinal stenosis, cervical region: Secondary | ICD-10-CM | POA: Diagnosis not present

## 2019-02-15 ENCOUNTER — Other Ambulatory Visit: Payer: Self-pay | Admitting: Neurosurgery

## 2019-02-25 ENCOUNTER — Other Ambulatory Visit: Payer: Self-pay | Admitting: Neurosurgery

## 2019-03-02 NOTE — Pre-Procedure Instructions (Signed)
Peyton Spengler  03/02/2019      [id number] - [place] Cao, [place] - [address] AT [facility] [address] [place] [address] Phone: [phone] Fax: [phone]    Your procedure is scheduled on Feb. 15  Report to [facility] entrance A at 7:15 A.M.  Call this number if you have problems the morning of surgery:  [phone]   Remember:  Do not eat or drink after midnight.      Take these medicines the morning of surgery with A SIP OF WATER :              Duloxetine (cymbalta)             Gabapentin (neurontin)             7 days prior to surgery STOP taking any Aspirin (unless otherwise instructed by your surgeon), Aleve, Naproxen, Ibuprofen, Motrin, Advil, Goody's, BC's, all herbal medications, fish oil, and all vitamins.    Do not wear jewelry.  Do not wear lotions, powders, or perfumes, or deodorant.  Do not shave 48 hours prior to surgery.  Men may shave face and neck.  Do not bring valuables to the hospital.  [facility] is not responsible for any belongings or valuables.  Contacts, dentures or bridgework may not be worn into surgery.  Leave your suitcase in the car.  After surgery it may be brought to your room.  For patients admitted to the hospital, discharge time will be determined by your treatment team.  Patients discharged the day of surgery will not be allowed to drive home.    Special instructions:  [facility]- Preparing For Surgery  Before surgery, you can play an important role. Because skin is not sterile, your skin needs to be as free of germs as possible. You can reduce the number of germs on your skin by washing with CHG (chlorahexidine gluconate) Soap before surgery.  CHG is an antiseptic cleaner which kills germs and bonds with the skin to continue killing germs even after washing.    Oral Hygiene is also important to reduce your risk of infection.  Remember - BRUSH YOUR TEETH THE MORNING OF SURGERY WITH YOUR  REGULAR TOOTHPASTE  Please do not use if you have an allergy to CHG or antibacterial soaps. If your skin becomes reddened/irritated stop using the CHG.  Do not shave (including legs and underarms) for at least 48 hours prior to first CHG shower. It is OK to shave your face.  Please follow these instructions carefully.   1. Shower the NIGHT BEFORE SURGERY and the MORNING OF SURGERY with CHG.   2. If you chose to wash your hair, wash your hair first as usual with your normal shampoo.  3. After you shampoo, rinse your hair and body thoroughly to remove the shampoo.  4. Use CHG as you would any other liquid soap. You can apply CHG directly to the skin and wash gently with a scrungie or a clean washcloth.   5. Apply the CHG Soap to your body ONLY FROM THE NECK DOWN.  Do not use on open wounds or open sores. Avoid contact with your eyes, ears, mouth and genitals (private parts). Wash Face and genitals (private parts)  with your normal soap.  6. Wash thoroughly, paying special attention to the area where your surgery will be performed.  7. Thoroughly rinse your body with warm water from the neck down.  8.  DO NOT shower/wash with your normal soap after using and rinsing off the CHG Soap.  9. Pat yourself dry with a CLEAN TOWEL.  10. Wear CLEAN PAJAMAS to bed the night before surgery, wear comfortable clothes the morning of surgery  11. Place CLEAN SHEETS on your bed the night of your first shower and DO NOT SLEEP WITH PETS.    Day of Surgery:  Do not apply any deodorants/lotions.  Please wear clean clothes to the hospital/surgery center.   Remember to brush your teeth WITH YOUR REGULAR TOOTHPASTE.    Please read over the following fact sheets that you were given. Coughing and Deep Breathing and Surgical Site Infection Prevention

## 2019-03-03 ENCOUNTER — Other Ambulatory Visit (HOSPITAL_COMMUNITY)
Admission: RE | Admit: 2019-03-03 | Discharge: 2019-03-03 | Disposition: A | Discharge status: Home / Self Care | Payer: Medicare HMO | Source: Ambulatory Visit | Attending: Neurosurgery | Admitting: Neurosurgery

## 2019-03-03 ENCOUNTER — Other Ambulatory Visit: Payer: Self-pay

## 2019-03-03 ENCOUNTER — Encounter (HOSPITAL_COMMUNITY)
Admission: RE | Admit: 2019-03-03 | Discharge: 2019-03-03 | Disposition: A | Discharge status: Home / Self Care | Payer: Medicare HMO | Source: Ambulatory Visit | Attending: Neurosurgery | Admitting: Neurosurgery

## 2019-03-03 ENCOUNTER — Encounter (HOSPITAL_COMMUNITY): Payer: Self-pay

## 2019-03-03 DIAGNOSIS — Z20822 Contact with and (suspected) exposure to covid-19: Secondary | ICD-10-CM | POA: Insufficient documentation

## 2019-03-03 DIAGNOSIS — M47812 Spondylosis without myelopathy or radiculopathy, cervical region: Secondary | ICD-10-CM | POA: Insufficient documentation

## 2019-03-03 DIAGNOSIS — Z01812 Encounter for preprocedural laboratory examination: Secondary | ICD-10-CM | POA: Insufficient documentation

## 2019-03-03 HISTORY — DX: Depression, unspecified: F32.A

## 2019-03-03 HISTORY — DX: Sleep apnea, unspecified: G47.30

## 2019-03-03 HISTORY — DX: Unspecified osteoarthritis, unspecified site: M19.90

## 2019-03-03 LAB — SURGICAL PCR SCREEN
MRSA, PCR: NEGATIVE
Staphylococcus aureus: NEGATIVE

## 2019-03-03 LAB — CBC
HCT: 45.8 % (ref 39.0–52.0)
Hemoglobin: 15.2 g/dL (ref 13.0–17.0)
MCH: 31.1 pg (ref 26.0–34.0)
MCHC: 33.2 g/dL (ref 30.0–36.0)
MCV: 93.7 fL (ref 80.0–100.0)
Platelets: 316 10*3/uL (ref 150–400)
RBC: 4.89 MIL/uL (ref 4.22–5.81)
RDW: 13.2 % (ref 11.5–15.5)
WBC: 11.4 10*3/uL — ABNORMAL HIGH (ref 4.0–10.5)
nRBC: 0 % (ref 0.0–0.2)

## 2019-03-03 LAB — TYPE AND SCREEN
ABO/RH(D): O POS
Antibody Screen: NEGATIVE

## 2019-03-03 LAB — ABO/RH: ABO/RH(D): O POS

## 2019-03-03 LAB — SARS CORONAVIRUS 2 (TAT 6-24 HRS): SARS Coronavirus 2: NEGATIVE

## 2019-03-03 NOTE — Progress Notes (Addendum)
PCP - Dr. Alinda Deem Cardiologist - Denies  PPM/ICD - Denies  Chest x-ray - Denies EKG - Denies Stress Test - Denies ECHO - Denies Cardiac Cath - Denies  Sleep Study - Yes, Pt states in 2012. Records requested CPAP - Denies  Pt denies being diabetic.   Blood Thinner Instructions: N/A Aspirin Instructions:N/A  ERAS Protcol - No   COVID TEST- Scheduled 03/03/2019   Coronavirus Screening  Have you experienced the following symptoms:  Cough yes/no: No Fever (>100.16F)  yes/no: No Runny nose yes/no: No Sore throat yes/no: No Difficulty breathing/shortness of breath  yes/no: No  Have you or a family member traveled in the last 14 days and where? yes/no: No   If the patient indicates "YES" to the above questions, their PAT will be rescheduled to limit the exposure to others and, the surgeon will be notified. THE PATIENT WILL NEED TO BE ASYMPTOMATIC FOR 14 DAYS.   If the patient is not experiencing any of these symptoms, the PAT nurse will instruct them to NOT bring anyone with them to their appointment since they may have these symptoms or traveled as well.   Please remind your patients and families that hospital visitation restrictions are in effect and the importance of the restrictions.     Anesthesia review: Yes, OSA; requested sleep study results from [facility].  Patient denies shortness of breath, fever, cough and chest pain at PAT appointment   All instructions explained to the patient, with a verbal understanding of the material. Patient agrees to go over the instructions while at home for a better understanding. Patient also instructed to self quarantine after being tested for COVID-19. The opportunity to ask questions was provided.

## 2019-03-04 MED ORDER — VANCOMYCIN HCL 1500 MG/300ML IV SOLN
1500.0000 mg | INTRAVENOUS | Status: AC
  Administered 2019-03-07: 07:00:00 1500 mg via INTRAVENOUS
  Filled 2019-03-04 (×3): qty 300

## 2019-03-06 NOTE — Progress Notes (Signed)
Taylor Coffey called back to confirm that he will arrive for surgery tomorrow at 0530.

## 2019-03-06 NOTE — Progress Notes (Signed)
LVM for this pt and his next of contact to inform pt to arrive tomorrow for surgery at 0530. Pt given the number to the OR desk to call back to confirm.

## 2019-03-06 NOTE — Anesthesia Preprocedure Evaluation (Addendum)
Anesthesia Evaluation  Patient identified by MRN, date of birth, ID band Patient awake    Reviewed: Allergy & Precautions, NPO status , Patient's Chart, lab work & pertinent test results  History of Anesthesia Complications (+) PONV and history of anesthetic complications  Airway Mallampati: II  TM Distance: >3 FB Neck ROM: Limited    Dental  (+) Dental Advisory Given, Edentulous Lower, Edentulous Upper   Pulmonary sleep apnea , COPD, Current SmokerPatient did not abstain from smoking.,    Pulmonary exam normal breath sounds clear to auscultation       Cardiovascular negative cardio ROS Normal cardiovascular exam Rhythm:Regular Rate:Normal     Neuro/Psych PSYCHIATRIC DISORDERS Anxiety Depression CERVICAL SPONDYLOSIS WITH MYELOPATHY AND RADICULOPATHY    GI/Hepatic negative GI ROS, Neg liver ROS,   Endo/Other  negative endocrine ROS  Renal/GU negative Renal ROS     Musculoskeletal  (+) Arthritis ,   Abdominal   Peds  Hematology negative hematology ROS (+)   Anesthesia Other Findings Day of surgery medications reviewed with the patient.  Reproductive/Obstetrics                            Anesthesia Physical Anesthesia Plan  ASA: III  Anesthesia Plan: General   Post-op Pain Management:    Induction: Intravenous  PONV Risk Score and Plan: 3 and Midazolam, Dexamethasone, Ondansetron, Propofol infusion and Diphenhydramine  Airway Management Planned: Oral ETT and Video Laryngoscope Planned  Additional Equipment:   Intra-op Plan:   Post-operative Plan: Extubation in OR  Informed Consent: I have reviewed the patients History and Physical, chart, labs and discussed the procedure including the risks, benefits and alternatives for the proposed anesthesia with the patient or authorized representative who has indicated his/her understanding and acceptance.     Dental advisory  given  Plan Discussed with: CRNA  Anesthesia Plan Comments: (2nd PIV)       Anesthesia Quick Evaluation

## 2019-03-07 ENCOUNTER — Encounter (HOSPITAL_COMMUNITY)
Admission: RE | Disposition: A | Discharge status: Home / Self Care | Payer: Self-pay | Source: Home / Self Care | Attending: Neurosurgery

## 2019-03-07 ENCOUNTER — Other Ambulatory Visit: Payer: Self-pay

## 2019-03-07 ENCOUNTER — Ambulatory Visit (HOSPITAL_COMMUNITY): Payer: Medicare HMO | Admitting: Physician Assistant

## 2019-03-07 ENCOUNTER — Ambulatory Visit (HOSPITAL_COMMUNITY): Payer: Medicare HMO | Admitting: Anesthesiology

## 2019-03-07 ENCOUNTER — Encounter (HOSPITAL_COMMUNITY): Payer: Self-pay | Admitting: Neurosurgery

## 2019-03-07 ENCOUNTER — Ambulatory Visit (HOSPITAL_COMMUNITY): Payer: Medicare HMO

## 2019-03-07 ENCOUNTER — Ambulatory Visit (HOSPITAL_COMMUNITY)
Admission: RE | Admit: 2019-03-07 | Discharge: 2019-03-07 | Disposition: A | Discharge status: Home / Self Care | Payer: Medicare HMO | Attending: Neurosurgery | Admitting: Neurosurgery

## 2019-03-07 DIAGNOSIS — M4722 Other spondylosis with radiculopathy, cervical region: Secondary | ICD-10-CM | POA: Diagnosis not present

## 2019-03-07 DIAGNOSIS — M4322 Fusion of spine, cervical region: Secondary | ICD-10-CM | POA: Diagnosis not present

## 2019-03-07 DIAGNOSIS — M4712 Other spondylosis with myelopathy, cervical region: Secondary | ICD-10-CM | POA: Diagnosis not present

## 2019-03-07 DIAGNOSIS — J449 Chronic obstructive pulmonary disease, unspecified: Secondary | ICD-10-CM | POA: Insufficient documentation

## 2019-03-07 DIAGNOSIS — Z79899 Other long term (current) drug therapy: Secondary | ICD-10-CM | POA: Insufficient documentation

## 2019-03-07 DIAGNOSIS — G4730 Sleep apnea, unspecified: Secondary | ICD-10-CM | POA: Insufficient documentation

## 2019-03-07 DIAGNOSIS — M542 Cervicalgia: Secondary | ICD-10-CM | POA: Diagnosis not present

## 2019-03-07 DIAGNOSIS — F419 Anxiety disorder, unspecified: Secondary | ICD-10-CM | POA: Diagnosis not present

## 2019-03-07 DIAGNOSIS — F17210 Nicotine dependence, cigarettes, uncomplicated: Secondary | ICD-10-CM | POA: Insufficient documentation

## 2019-03-07 DIAGNOSIS — F329 Major depressive disorder, single episode, unspecified: Secondary | ICD-10-CM | POA: Insufficient documentation

## 2019-03-07 DIAGNOSIS — Z472 Encounter for removal of internal fixation device: Secondary | ICD-10-CM | POA: Insufficient documentation

## 2019-03-07 DIAGNOSIS — Z419 Encounter for procedure for purposes other than remedying health state, unspecified: Secondary | ICD-10-CM

## 2019-03-07 DIAGNOSIS — M4802 Spinal stenosis, cervical region: Secondary | ICD-10-CM | POA: Insufficient documentation

## 2019-03-07 DIAGNOSIS — M5412 Radiculopathy, cervical region: Secondary | ICD-10-CM | POA: Diagnosis not present

## 2019-03-07 DIAGNOSIS — Z981 Arthrodesis status: Secondary | ICD-10-CM | POA: Diagnosis not present

## 2019-03-07 HISTORY — PX: ANTERIOR CERVICAL DECOMP/DISCECTOMY FUSION: SHX1161

## 2019-03-07 LAB — BASIC METABOLIC PANEL
Anion gap: 12 (ref 5–15)
BUN: 11 mg/dL (ref 6–20)
CO2: 24 mmol/L (ref 22–32)
Calcium: 9.1 mg/dL (ref 8.9–10.3)
Chloride: 102 mmol/L (ref 98–111)
Creatinine, Ser: 0.94 mg/dL (ref 0.61–1.24)
GFR calc Af Amer: >60 mL/min (ref >60)
GFR calc non Af Amer: >60 mL/min (ref >60)
Glucose, Bld: 227 mg/dL — ABNORMAL HIGH (ref 70–99)
Potassium: 4.5 mmol/L (ref 3.5–5.1)
Sodium: 138 mmol/L (ref 135–145)

## 2019-03-07 SURGERY — ANTERIOR CERVICAL DECOMPRESSION/DISCECTOMY FUSION 2 LEVEL/HARDWARE REMOVAL
Anesthesia: General | Site: Spine Cervical

## 2019-03-07 MED ORDER — ACETAMINOPHEN 325 MG PO TABS: 650.0000 mg | ORAL_TABLET | ORAL | Status: DC | PRN

## 2019-03-07 MED ORDER — ALBUTEROL SULFATE HFA 108 (90 BASE) MCG/ACT IN AERS
INHALATION_SPRAY | RESPIRATORY_TRACT | Status: DC | PRN
  Administered 2019-03-07 (×2): 6 via RESPIRATORY_TRACT

## 2019-03-07 MED ORDER — ACETAMINOPHEN 500 MG PO TABS
1000.0000 mg | ORAL_TABLET | Freq: Four times a day (QID) | ORAL | Status: DC
  Administered 2019-03-07: 1000 mg via ORAL
  Filled 2019-03-07: qty 2

## 2019-03-07 MED ORDER — PHENYLEPHRINE HCL-NACL 10-0.9 MG/250ML-% IV SOLN
INTRAVENOUS | Status: DC | PRN
  Administered 2019-03-07: 60 ug/min via INTRAVENOUS
  Administered 2019-03-07: 80 ug/min via INTRAVENOUS

## 2019-03-07 MED ORDER — THROMBIN 5000 UNITS EX SOLR
OROMUCOSAL | Status: DC | PRN
  Administered 2019-03-07: 5 mL via TOPICAL

## 2019-03-07 MED ORDER — ONDANSETRON HCL 4 MG/2ML IJ SOLN
INTRAMUSCULAR | Status: DC | PRN
  Administered 2019-03-07: 4 mg via INTRAVENOUS

## 2019-03-07 MED ORDER — ALBUTEROL SULFATE HFA 108 (90 BASE) MCG/ACT IN AERS
INHALATION_SPRAY | RESPIRATORY_TRACT | Status: AC
  Filled 2019-03-07: qty 6.7

## 2019-03-07 MED ORDER — VANCOMYCIN HCL IN DEXTROSE 1-5 GM/200ML-% IV SOLN
INTRAVENOUS | Status: AC
  Administered 2019-03-07: 1000 mg
  Filled 2019-03-07: qty 200

## 2019-03-07 MED ORDER — ROCURONIUM BROMIDE 50 MG/5ML IV SOSY
PREFILLED_SYRINGE | INTRAVENOUS | Status: DC | PRN
  Administered 2019-03-07: 40 mg via INTRAVENOUS
  Administered 2019-03-07: 10 mg via INTRAVENOUS
  Administered 2019-03-07: 20 mg via INTRAVENOUS
  Administered 2019-03-07: 60 mg via INTRAVENOUS

## 2019-03-07 MED ORDER — PANTOPRAZOLE SODIUM 40 MG IV SOLR: 40.0000 mg | Freq: Every day | INTRAVENOUS | Status: DC

## 2019-03-07 MED ORDER — LIDOCAINE 2% (20 MG/ML) 5 ML SYRINGE
INTRAMUSCULAR | Status: DC | PRN
  Administered 2019-03-07: 100 mg via INTRAVENOUS

## 2019-03-07 MED ORDER — DOCUSATE SODIUM 100 MG PO CAPS: 100.0000 mg | ORAL_CAPSULE | Freq: Two times a day (BID) | ORAL | 0 refills | Status: DC

## 2019-03-07 MED ORDER — DOCUSATE SODIUM 100 MG PO CAPS
100.0000 mg | ORAL_CAPSULE | Freq: Two times a day (BID) | ORAL | Status: DC
  Administered 2019-03-07: 100 mg via ORAL
  Filled 2019-03-07: qty 1

## 2019-03-07 MED ORDER — PHENOL 1.4 % MT LIQD: 1.0000 | OROMUCOSAL | Status: DC | PRN

## 2019-03-07 MED ORDER — MENTHOL 3 MG MT LOZG: 1.0000 | LOZENGE | OROMUCOSAL | Status: DC | PRN

## 2019-03-07 MED ORDER — LIDOCAINE-EPINEPHRINE 1 %-1:100000 IJ SOLN
INTRAMUSCULAR | Status: AC
  Filled 2019-03-07: qty 1

## 2019-03-07 MED ORDER — MIDAZOLAM HCL 2 MG/2ML IJ SOLN
INTRAMUSCULAR | Status: AC
  Filled 2019-03-07: qty 2

## 2019-03-07 MED ORDER — SODIUM CHLORIDE 0.9 % IV SOLN
INTRAVENOUS | Status: DC | PRN
  Administered 2019-03-07: 09:00:00 500 mL

## 2019-03-07 MED ORDER — CYCLOBENZAPRINE HCL 10 MG PO TABS: 10.0000 mg | ORAL_TABLET | Freq: Three times a day (TID) | ORAL | Status: DC | PRN

## 2019-03-07 MED ORDER — ONDANSETRON HCL 4 MG/2ML IJ SOLN: 4.0000 mg | Freq: Four times a day (QID) | INTRAMUSCULAR | Status: DC | PRN

## 2019-03-07 MED ORDER — DULOXETINE HCL 20 MG PO CPEP
20.0000 mg | ORAL_CAPSULE | Freq: Every day | ORAL | Status: DC
  Administered 2019-03-07: 14:00:00 20 mg via ORAL
  Filled 2019-03-07: qty 1

## 2019-03-07 MED ORDER — LIDOCAINE 2% (20 MG/ML) 5 ML SYRINGE
INTRAMUSCULAR | Status: AC
  Filled 2019-03-07: qty 5

## 2019-03-07 MED ORDER — CYCLOBENZAPRINE HCL 10 MG PO TABS
ORAL_TABLET | ORAL | Status: AC
  Administered 2019-03-07: 10 mg via ORAL
  Filled 2019-03-07: qty 1

## 2019-03-07 MED ORDER — ALUM & MAG HYDROXIDE-SIMETH 200-200-20 MG/5ML PO SUSP: 30.0000 mL | Freq: Four times a day (QID) | ORAL | Status: DC | PRN

## 2019-03-07 MED ORDER — SUGAMMADEX SODIUM 200 MG/2ML IV SOLN
INTRAVENOUS | Status: DC | PRN
  Administered 2019-03-07: 200 mg via INTRAVENOUS

## 2019-03-07 MED ORDER — CHLORHEXIDINE GLUCONATE CLOTH 2 % EX PADS: 6.0000 | MEDICATED_PAD | Freq: Once | CUTANEOUS | Status: DC

## 2019-03-07 MED ORDER — VANCOMYCIN HCL IN DEXTROSE 1-5 GM/200ML-% IV SOLN: 1000.0000 mg | Freq: Two times a day (BID) | INTRAVENOUS | Status: DC

## 2019-03-07 MED ORDER — ONDANSETRON HCL 4 MG/2ML IJ SOLN
INTRAMUSCULAR | Status: AC
  Filled 2019-03-07: qty 2

## 2019-03-07 MED ORDER — ROCURONIUM BROMIDE 10 MG/ML (PF) SYRINGE
PREFILLED_SYRINGE | INTRAVENOUS | Status: AC
  Filled 2019-03-07: qty 10

## 2019-03-07 MED ORDER — PROPOFOL 10 MG/ML IV BOLUS
INTRAVENOUS | Status: DC | PRN
  Administered 2019-03-07: 180 mg via INTRAVENOUS

## 2019-03-07 MED ORDER — OXYCODONE-ACETAMINOPHEN 5-325 MG PO TABS: 1.0000 | ORAL_TABLET | ORAL | 0 refills | Status: DC | PRN

## 2019-03-07 MED ORDER — CYCLOBENZAPRINE HCL 10 MG PO TABS: 10.0000 mg | ORAL_TABLET | Freq: Three times a day (TID) | ORAL | 0 refills | Status: DC | PRN

## 2019-03-07 MED ORDER — OXYCODONE HCL 5 MG PO TABS
ORAL_TABLET | ORAL | Status: AC
  Administered 2019-03-07: 5 mg via ORAL
  Filled 2019-03-07: qty 1

## 2019-03-07 MED ORDER — FENTANYL CITRATE (PF) 100 MCG/2ML IJ SOLN: 25.0000 ug | INTRAMUSCULAR | Status: DC | PRN

## 2019-03-07 MED ORDER — DEXAMETHASONE SODIUM PHOSPHATE 10 MG/ML IJ SOLN
INTRAMUSCULAR | Status: AC
  Filled 2019-03-07: qty 1

## 2019-03-07 MED ORDER — EPHEDRINE SULFATE-NACL 50-0.9 MG/10ML-% IV SOSY
PREFILLED_SYRINGE | INTRAVENOUS | Status: DC | PRN
  Administered 2019-03-07 (×2): 5 mg via INTRAVENOUS

## 2019-03-07 MED ORDER — LIDOCAINE-EPINEPHRINE 1 %-1:100000 IJ SOLN
INTRAMUSCULAR | Status: DC | PRN
  Administered 2019-03-07: 10 mL

## 2019-03-07 MED ORDER — GABAPENTIN 400 MG PO CAPS
800.0000 mg | ORAL_CAPSULE | Freq: Three times a day (TID) | ORAL | Status: DC
  Administered 2019-03-07: 18:00:00 800 mg via ORAL
  Filled 2019-03-07: qty 2

## 2019-03-07 MED ORDER — ONDANSETRON HCL 4 MG PO TABS: 4.0000 mg | ORAL_TABLET | Freq: Four times a day (QID) | ORAL | Status: DC | PRN

## 2019-03-07 MED ORDER — PHENYLEPHRINE 40 MCG/ML (10ML) SYRINGE FOR IV PUSH (FOR BLOOD PRESSURE SUPPORT)
PREFILLED_SYRINGE | INTRAVENOUS | Status: AC
  Filled 2019-03-07: qty 10

## 2019-03-07 MED ORDER — FENTANYL CITRATE (PF) 250 MCG/5ML IJ SOLN
INTRAMUSCULAR | Status: AC
  Filled 2019-03-07: qty 5

## 2019-03-07 MED ORDER — FENTANYL CITRATE (PF) 100 MCG/2ML IJ SOLN
INTRAMUSCULAR | Status: DC | PRN
  Administered 2019-03-07 (×2): 100 ug via INTRAVENOUS
  Administered 2019-03-07 (×4): 50 ug via INTRAVENOUS

## 2019-03-07 MED ORDER — PROPOFOL 10 MG/ML IV BOLUS
INTRAVENOUS | Status: AC
  Filled 2019-03-07: qty 20

## 2019-03-07 MED ORDER — FENTANYL CITRATE (PF) 100 MCG/2ML IJ SOLN
INTRAMUSCULAR | Status: AC
  Administered 2019-03-07: 50 ug via INTRAVENOUS
  Filled 2019-03-07: qty 2

## 2019-03-07 MED ORDER — THROMBIN 5000 UNITS EX SOLR
CUTANEOUS | Status: AC
  Filled 2019-03-07: qty 5000

## 2019-03-07 MED ORDER — DEXAMETHASONE SODIUM PHOSPHATE 10 MG/ML IJ SOLN
INTRAMUSCULAR | Status: DC | PRN
  Administered 2019-03-07: 10 mg via INTRAVENOUS

## 2019-03-07 MED ORDER — PHENYLEPHRINE HCL (PRESSORS) 10 MG/ML IV SOLN: INTRAVENOUS | Status: DC | PRN

## 2019-03-07 MED ORDER — OXYCODONE-ACETAMINOPHEN 5-325 MG PO TABS: 1.0000 | ORAL_TABLET | ORAL | Status: DC | PRN

## 2019-03-07 MED ORDER — OXYCODONE HCL 5 MG PO TABS: 10.0000 mg | ORAL_TABLET | ORAL | Status: DC | PRN

## 2019-03-07 MED ORDER — OXYCODONE HCL 5 MG PO TABS: 5.0000 mg | ORAL_TABLET | ORAL | Status: DC | PRN

## 2019-03-07 MED ORDER — BISACODYL 10 MG RE SUPP: 10.0000 mg | Freq: Every day | RECTAL | Status: DC | PRN

## 2019-03-07 MED ORDER — LACTATED RINGERS IV SOLN: INTRAVENOUS | Status: DC | PRN

## 2019-03-07 MED ORDER — EPHEDRINE 5 MG/ML INJ
INTRAVENOUS | Status: AC
  Filled 2019-03-07: qty 10

## 2019-03-07 MED ORDER — MORPHINE SULFATE (PF) 4 MG/ML IV SOLN
4.0000 mg | INTRAVENOUS | Status: DC | PRN
  Administered 2019-03-07: 14:00:00 4 mg via INTRAVENOUS
  Filled 2019-03-07: qty 1

## 2019-03-07 MED ORDER — DEXAMETHASONE 4 MG PO TABS: 4.0000 mg | ORAL_TABLET | Freq: Four times a day (QID) | ORAL | Status: DC

## 2019-03-07 MED ORDER — BACITRACIN ZINC 500 UNIT/GM EX OINT
TOPICAL_OINTMENT | CUTANEOUS | Status: AC
  Filled 2019-03-07: qty 28.35

## 2019-03-07 MED ORDER — DEXMEDETOMIDINE HCL 200 MCG/2ML IV SOLN
INTRAVENOUS | Status: DC | PRN
  Administered 2019-03-07: 12 ug via INTRAVENOUS
  Administered 2019-03-07: 20 ug via INTRAVENOUS

## 2019-03-07 MED ORDER — PHENYLEPHRINE 40 MCG/ML (10ML) SYRINGE FOR IV PUSH (FOR BLOOD PRESSURE SUPPORT)
PREFILLED_SYRINGE | INTRAVENOUS | Status: DC | PRN
  Administered 2019-03-07 (×2): 80 ug via INTRAVENOUS

## 2019-03-07 MED ORDER — DEXAMETHASONE SODIUM PHOSPHATE 4 MG/ML IJ SOLN
4.0000 mg | Freq: Four times a day (QID) | INTRAMUSCULAR | Status: DC
  Administered 2019-03-07: 4 mg via INTRAVENOUS
  Filled 2019-03-07: qty 1

## 2019-03-07 MED ORDER — MIDAZOLAM HCL 5 MG/5ML IJ SOLN
INTRAMUSCULAR | Status: DC | PRN
  Administered 2019-03-07: 2 mg via INTRAVENOUS

## 2019-03-07 MED ORDER — PROMETHAZINE HCL 25 MG/ML IJ SOLN: 6.2500 mg | INTRAMUSCULAR | Status: DC | PRN

## 2019-03-07 MED ORDER — ACETAMINOPHEN 500 MG PO TABS
1000.0000 mg | ORAL_TABLET | Freq: Once | ORAL | Status: AC
  Administered 2019-03-07: 1000 mg via ORAL
  Filled 2019-03-07: qty 2

## 2019-03-07 MED ORDER — 0.9 % SODIUM CHLORIDE (POUR BTL) OPTIME
TOPICAL | Status: DC | PRN
  Administered 2019-03-07: 09:00:00 1000 mL

## 2019-03-07 MED ORDER — ACETAMINOPHEN 650 MG RE SUPP: 650.0000 mg | RECTAL | Status: DC | PRN

## 2019-03-07 MED ORDER — BACITRACIN ZINC 500 UNIT/GM EX OINT
TOPICAL_OINTMENT | CUTANEOUS | Status: DC | PRN
  Administered 2019-03-07: 1 via TOPICAL

## 2019-03-07 MED ORDER — LACTATED RINGERS IV SOLN: INTRAVENOUS | Status: DC

## 2019-03-07 SURGICAL SUPPLY — 73 items
BAG DECANTER FOR FLEXI CONT (MISCELLANEOUS) ×3 IMPLANT
BENZOIN TINCTURE PRP APPL 2/3 (GAUZE/BANDAGES/DRESSINGS) ×3 IMPLANT
BIT DRILL NEURO 2X3.1 SFT TUCH (MISCELLANEOUS) ×1 IMPLANT
BLADE SURG 15 STRL LF DISP TIS (BLADE) ×1 IMPLANT
BLADE SURG 15 STRL SS (BLADE) ×2
BLADE ULTRA TIP 2M (BLADE) ×3 IMPLANT
BUR BARREL STRAIGHT FLUTE 4.0 (BURR) ×3 IMPLANT
BUR MATCHSTICK NEURO 3.0 LAGG (BURR) ×3 IMPLANT
CAGE CERV MOD 7X15X12 7D (Cage) ×3 IMPLANT
CAGE CERV MOD 8X15X12 7D (Cage) ×3 IMPLANT
CANISTER SUCT 3000ML PPV (MISCELLANEOUS) ×3 IMPLANT
CARTRIDGE OIL MAESTRO DRILL (MISCELLANEOUS) ×1 IMPLANT
CLOSURE WOUND 1/2 X4 (GAUZE/BANDAGES/DRESSINGS) ×1
CORD BIPOLAR FORCEPS 12FT (ELECTRODE) ×3 IMPLANT
COVER MAYO STAND STRL (DRAPES) ×3 IMPLANT
COVER WAND RF STERILE (DRAPES) IMPLANT
DECANTER SPIKE VIAL GLASS SM (MISCELLANEOUS) ×3 IMPLANT
DIFFUSER DRILL AIR PNEUMATIC (MISCELLANEOUS) ×3 IMPLANT
DRAIN JACKSON PRATT 10MM FLAT (MISCELLANEOUS) ×3 IMPLANT
DRAPE LAPAROTOMY 100X72 PEDS (DRAPES) ×3 IMPLANT
DRAPE MICROSCOPE LEICA (MISCELLANEOUS) IMPLANT
DRAPE SURG 17X23 STRL (DRAPES) ×6 IMPLANT
DRILL NEURO 2X3.1 SOFT TOUCH (MISCELLANEOUS) ×3
DRSG OPSITE POSTOP 4X6 (GAUZE/BANDAGES/DRESSINGS) ×3 IMPLANT
ELECT BLADE 4.0 EZ CLEAN MEGAD (MISCELLANEOUS) ×3
ELECT REM PT RETURN 9FT ADLT (ELECTROSURGICAL) ×3
ELECTRODE BLDE 4.0 EZ CLN MEGD (MISCELLANEOUS) ×1 IMPLANT
ELECTRODE REM PT RTRN 9FT ADLT (ELECTROSURGICAL) ×1 IMPLANT
EVACUATOR SILICONE 100CC (DRAIN) ×3 IMPLANT
GAUZE 4X4 16PLY RFD (DISPOSABLE) IMPLANT
GAUZE SPONGE 4X4 12PLY STRL (GAUZE/BANDAGES/DRESSINGS) IMPLANT
GLOVE BIO SURGEON STRL SZ 6.5 (GLOVE) ×2 IMPLANT
GLOVE BIO SURGEON STRL SZ8 (GLOVE) ×3 IMPLANT
GLOVE BIO SURGEON STRL SZ8.5 (GLOVE) ×3 IMPLANT
GLOVE BIO SURGEONS STRL SZ 6.5 (GLOVE) ×1
GLOVE BIOGEL PI IND STRL 6.5 (GLOVE) ×1 IMPLANT
GLOVE BIOGEL PI IND STRL 8 (GLOVE) ×2 IMPLANT
GLOVE BIOGEL PI INDICATOR 6.5 (GLOVE) ×2
GLOVE BIOGEL PI INDICATOR 8 (GLOVE) ×4
GLOVE ECLIPSE 7.0 STRL STRAW (GLOVE) ×3 IMPLANT
GLOVE ECLIPSE 7.5 STRL STRAW (GLOVE) ×9 IMPLANT
GLOVE EXAM NITRILE XL STR (GLOVE) IMPLANT
GOWN STRL REUS W/ TWL LRG LVL3 (GOWN DISPOSABLE) ×1 IMPLANT
GOWN STRL REUS W/ TWL XL LVL3 (GOWN DISPOSABLE) ×1 IMPLANT
GOWN STRL REUS W/TWL LRG LVL3 (GOWN DISPOSABLE) ×2
GOWN STRL REUS W/TWL XL LVL3 (GOWN DISPOSABLE) ×2
HEMOSTAT POWDER KIT SURGIFOAM (HEMOSTASIS) ×3 IMPLANT
KIT BASIN OR (CUSTOM PROCEDURE TRAY) ×3 IMPLANT
KIT TURNOVER KIT B (KITS) ×3 IMPLANT
MARKER SKIN DUAL TIP RULER LAB (MISCELLANEOUS) ×3 IMPLANT
NEEDLE HYPO 22GX1.5 SAFETY (NEEDLE) ×3 IMPLANT
NEEDLE SPNL 18GX3.5 QUINCKE PK (NEEDLE) ×3 IMPLANT
NS IRRIG 1000ML POUR BTL (IV SOLUTION) ×3 IMPLANT
OIL CARTRIDGE MAESTRO DRILL (MISCELLANEOUS) ×3
PACK LAMINECTOMY NEURO (CUSTOM PROCEDURE TRAY) ×3 IMPLANT
PATTIES SURGICAL .5 X.5 (GAUZE/BANDAGES/DRESSINGS) ×3 IMPLANT
PATTIES SURGICAL 1X1 (DISPOSABLE) ×3 IMPLANT
PIN DISTRACTION 14MM (PIN) ×6 IMPLANT
PLATE ARCHON 2-LEVEL 50MM (Plate) ×3 IMPLANT
PUTTY DBM 2CC CALC GRAN (Putty) ×3 IMPLANT
RUBBERBAND STERILE (MISCELLANEOUS) IMPLANT
SCREW ARCHON ST VAR 4.0X15MM (Screw) ×8 IMPLANT
SCREW ARCHON ST VAR 4.5X15MM (Screw) ×6 IMPLANT
SCREW BN 15X4XST VA NS SPN (Screw) ×4 IMPLANT
SPONGE INTESTINAL PEANUT (DISPOSABLE) ×12 IMPLANT
SPONGE SURGIFOAM ABS GEL SZ50 (HEMOSTASIS) IMPLANT
STRIP CLOSURE SKIN 1/2X4 (GAUZE/BANDAGES/DRESSINGS) ×2 IMPLANT
SUT VIC AB 0 CT1 27 (SUTURE) ×2
SUT VIC AB 0 CT1 27XBRD ANTBC (SUTURE) ×1 IMPLANT
SUT VIC AB 3-0 SH 8-18 (SUTURE) ×3 IMPLANT
TOWEL GREEN STERILE (TOWEL DISPOSABLE) ×3 IMPLANT
TOWEL GREEN STERILE FF (TOWEL DISPOSABLE) ×3 IMPLANT
WATER STERILE IRR 1000ML POUR (IV SOLUTION) ×3 IMPLANT

## 2019-03-07 NOTE — Progress Notes (Signed)
Patient is discharged from room 3C08 at this time. Alert and in stable condition. IV site d/c'[d and instructions read to patient and spouse with understanding verbalized. Left unit via wheelchair with all belongings at side. 

## 2019-03-07 NOTE — Transfer of Care (Signed)
Immediate Anesthesia Transfer of Care Note  Patient: Taylor Coffey  Procedure(s) Performed: ANTERIOR CERVICAL DECOMPRESSION/DISCECTOMY FUSION, INTERBODY PROSTHESIS, PLATE/SCREWS CERVICLA THREE- CERVICAL FOUR, CERVICAL FOUR- CERVICAL FIVE; EXPLORE FUSION WITH POSSIBLE HARDWARE REMOVAL (N/A Spine Cervical)  Patient Location: PACU  Anesthesia Type:General  Level of Consciousness: awake, alert  and oriented  Airway & Oxygen Therapy: Patient Spontanous Breathing and Patient connected to nasal cannula oxygen  Post-op Assessment: Report given to RN and Post -op Vital signs reviewed and stable  Post vital signs: Reviewed and stable  Last Vitals:  Vitals Value Taken Time  BP 92/56 03/07/19 1120  Temp    Pulse 100 03/07/19 1122  Resp 20 03/07/19 1122  SpO2 92 % 03/07/19 1122  Vitals shown include unvalidated device data.  Last Pain:  Vitals:   03/07/19 0602  TempSrc:   PainSc: 7          Complications: No apparent anesthesia complications

## 2019-03-07 NOTE — Anesthesia Procedure Notes (Addendum)
Procedure Name: Intubation Date/Time: 03/07/2019 7:40 AM Performed by: Julian Reil, CRNA Pre-anesthesia Checklist: Patient identified, Emergency Drugs available, Suction available and Patient being monitored Patient Re-evaluated:Patient Re-evaluated prior to induction Oxygen Delivery Method: Circle system utilized Preoxygenation: Pre-oxygenation with 100% oxygen Induction Type: IV induction Ventilation: Mask ventilation without difficulty and Oral airway inserted - appropriate to patient size Laryngoscope Size: Glidescope and 4 Grade View: Grade I Tube type: Oral Tube size: 7.5 mm Number of attempts: 1 Airway Equipment and Method: Stylet Placement Confirmation: ETT inserted through vocal cords under direct vision,  positive ETCO2 and breath sounds checked- equal and bilateral Secured at: 23 cm Tube secured with: Tape Dental Injury: Teeth and Oropharynx as per pre-operative assessment  Comments: Minimal extension of neck with DL.

## 2019-03-07 NOTE — Discharge Summary (Signed)
Physician Discharge Summary  Patient ID: Taylor Coffey MRN: [id number] DOB/AGE: [birth year] 39 y.o.  Admit date: 03/07/2019 Discharge date: 03/07/2019  Admission Diagnoses: Cervical spondylosis, cervical stenosis, cervical radiculopathy, cervical myelopathy, cervicalgia  Discharge Diagnoses: The same Active Problems:   Cervical spondylosis with myelopathy and radiculopathy   Discharged Condition: good  Hospital Course: I performed an exploration of the patient cervical fusion/removal of old cervical plate and a Z3-2 and C4-5 anterior cervical discectomy, fusion and plating on the patient on 03/07/2019.  The surgery went well.  The patient's postoperative course was unremarkable.  On the evening of surgery the patient requested discharge to home.  He was given written and oral discharge instructions.  All their questions were answered.  Consults: PT Significant Diagnostic Studies: None Treatments: Exploration of cervical fusion/removal of old cervical plate; D9-2 and C4-5 anterior cervical discectomy, fusion and plating. Discharge Exam: Blood pressure 111/67, pulse 74, temperature 97.6 F (36.4 C), temperature source Oral, resp. rate 16, height 5\' 5"  (1.651 m), weight 106.1 kg, SpO2 96 %. The patient is alert and pleasant.  He looks well.  His dressing is clean and dry.  There is no hematoma or shift.  His strength is normal.  Disposition: Home  Discharge Instructions     Remove dressing in 72 hours   Complete by: As directed    Call MD for:  difficulty breathing, headache or visual disturbances   Complete by: As directed    Call MD for:  extreme fatigue   Complete by: As directed    Call MD for:  hives   Complete by: As directed    Call MD for:  persistant dizziness or light-headedness   Complete by: As directed    Call MD for:  persistant nausea and vomiting   Complete by: As directed    Call MD for:  redness, tenderness, or signs of infection (pain, swelling, redness, odor  or green/yellow discharge around incision site)   Complete by: As directed    Call MD for:  severe uncontrolled pain   Complete by: As directed    Call MD for:  temperature >100.4   Complete by: As directed    Diet - low sodium heart healthy   Complete by: As directed    Discharge instructions   Complete by: As directed    Call [phone] for a followup appointment. Take a stool softener while you are using pain medications.   Driving Restrictions   Complete by: As directed    Do not drive for 2 weeks.   Increase activity slowly   Complete by: As directed    Lifting restrictions   Complete by: As directed    Do not lift more than 5 pounds. No excessive bending or twisting.   May shower / Bathe   Complete by: As directed    Remove the dressing for 3 days after surgery.  You may shower, but leave the incision alone.     Allergies as of 03/07/2019      Reactions   Vicodin [hydrocodone-acetaminophen] Itching   Amoxicillin Itching, Rash   Did it involve swelling of the face/tongue/throat, SOB, or low BP? No Did it involve sudden or severe rash/hives, skin peeling, or any reaction on the inside of your mouth or nose? No Did you need to seek medical attention at a hospital or doctor's office? No When did it last happen?20's If all above answers are "NO", may proceed with cephalosporin use.      Medication List  STOP taking these medications   methocarbamol 750 MG tablet Commonly known as: ROBAXIN     TAKE these medications   cyclobenzaprine 10 MG tablet Commonly known as: FLEXERIL Take 1 tablet (10 mg total) by mouth 3 (three) times daily as needed for muscle spasms.   diphenhydramine-acetaminophen 25-500 MG Tabs tablet Commonly known as: TYLENOL PM Take 4 tablets by mouth at bedtime.   docusate sodium 100 MG capsule Commonly known as: COLACE Take 1 capsule (100 mg total) by mouth 2 (two) times daily.   DULoxetine 20 MG capsule Commonly known as:  CYMBALTA Take 20 mg by mouth daily.   gabapentin 400 MG capsule Commonly known as: NEURONTIN Take 800 mg by mouth 3 (three) times daily.   oxyCODONE-acetaminophen 5-325 MG tablet Commonly known as: PERCOCET/ROXICET Take 1-2 tablets by mouth every 4 (four) hours as needed for moderate pain.        Signed: Ophelia Charter 03/07/2019, 5:57 PM

## 2019-03-07 NOTE — Op Note (Signed)
Brief history: The patient is a 39 year old white male who has had a previous C5-6 and C6-7 anterior cervical discectomy, fusion and plating by another physician in 2017.  He initially did well but has developed recurrent neck, shoulder and arm pain numbness and weakness.  He has failed medical management.  He was worked up with a cervical MRI which demonstrated spondylosis stenosis, etc. at C3-4 and C4-5.  I discussed the various treatment options with the patient.  He has decided proceed with surgery after weighing the risks, benefits and alternatives.  Preoperative diagnosis: C3-4 and C4-5 spondylosis, cervicalgia, cervical radiculopathy, cervical myelopathy, cervical spinal stenosis  Postoperative diagnosis: The same  Procedure: C3-4 and C4-5 anterior cervical discectomy/decompression; C3-4 and C4-5 interbody arthrodesis with local morcellized autograft bone and Zimmer DBM; insertion of interbody prosthesis at C3-4 and C4-5 (NuVasive titanium interbody prosthesis); anterior cervical plating from C3-C5 with globus titanium plate; exploration of cervical fusion/removal of cervical plate at A2-5 and K5-3  Surgeon: Dr. Earle Gell  Asst.: Arnetha Massy nurse practitioner  Anesthesia: Gen. endotracheal  Estimated blood loss: 100 cc  Drains: one 10 mm flat Jackson-Pratt drain in the prevertebral space  Complications: None  Description of procedure: The patient was brought to the operating room by the anesthesia team. General endotracheal anesthesia was induced. A roll was placed under the patient's shoulders to keep the neck in the neutral position. The patient's anterior cervical region was then prepared with Betadine scrub and Betadine solution. Sterile drapes were applied.  The area to be incised was then injected with Marcaine with epinephrine solution. I then used a scalpel to make a transverse incision in the patient's right anterior neck. I used the Metzenbaum scissors to dissect through  the scar tissue from the previous operation and to divide the platysmal muscle and then to dissect medial to the sternocleidomastoid muscle, jugular vein, and carotid artery. I carefully dissected down towards the anterior cervical spine identifying the esophagus and retracting it medially. Then using Kitner swabs to clear soft tissue from the anterior cervical spine, and expose the old cervical plate at Z7-6 and B3-4.  We then explored the fusion by unlocking the cam from the old plate at L9-3 and X9-0, removing the screws, then removing the plate.  The arthrodesis appeared solid.   I then used electrocautery to detach the medial border of the longus colli muscle bilaterally from the C3-4 and C4-5 intervertebral disc spaces. I then inserted the Caspar self-retaining retractor underneath the longus colli muscle bilaterally to provide exposure.  We then incised the intervertebral disc at C4-5. We then performed a partial intervertebral discectomy with a pituitary forceps and the Karlin curettes. I then inserted distraction screws into the vertebral bodies at C4 and C5, using one of the old screw holes at C5. We then distracted the interspace. We then used the high-speed drill to decorticate the vertebral endplates at W4-0, to drill away the remainder of the intervertebral disc, to drill away some posterior spondylosis, and to thin out the posterior longitudinal ligament. I then incised ligament with the arachnoid knife. We then removed the ligament with a Kerrison punches undercutting the vertebral endplates and decompressing the thecal sac. We then performed foraminotomies about the bilateral C5 nerve roots. This completed the decompression at this level.  We then repeated this procedure in an analogous fashion at C3-4 decompressing the thecal sac and the bilateral C4 nerve roots.  We now turned our to attention to the interbody fusion. We used the trial  spacers to determine the appropriate size for the  interbody prosthesis. We then pre-filled prosthesis with a combination of local morcellized autograft bone that we obtained during decompression as well as Zimmer DBM. We then inserted the prosthesis into the distracted interspace at C3-4 and C4-5. We then removed the distraction screws. There was a good snug fit of the prosthesis in the interspace.  Having completed the fusion we now turned attention to the anterior spinal instrumentation. We used the high-speed drill to drill away some anterior spondylosis at the disc spaces so that the plate lay down flat. We selected the appropriate length titanium anterior cervical plate. We laid it along the anterior aspect of the vertebral bodies from C3-C5. We then drilled 15 mm holes at C3 and C4, we used the old screw holes at C5. We then secured the plate to the vertebral bodies by placing two 15 mm self-tapping screws at C3, C4 and C5. We then obtained intraoperative radiograph. The demonstrating good position of the instrumentation. We therefore secured the screws the plate the locking each cam. This completed the instrumentation.  We then obtained hemostasis using bipolar electrocautery. We irrigated the wound out with bacitracin solution. We then removed the retractor. We inspected the esophagus for any damage. There was none apparent.  We placed a 10 mm flat Jackson-Pratt drain in the prevertebral space and tunneled it out through a separate stab wound.  We then reapproximated patient's platysmal muscle with interrupted 3-0 Vicryl suture. We then reapproximated the subcutaneous tissue with interrupted 3-0 Vicryl suture. The skin was reapproximated with Steri-Strips and benzoin. The wound was then covered with bacitracin ointment. A sterile dressing was applied. The drapes were removed. Patient was subsequently extubated by the anesthesia team and transported to the post anesthesia care unit in stable condition. All sponge instrument and needle counts were  reportedly correct at the end of this case.

## 2019-03-07 NOTE — Anesthesia Postprocedure Evaluation (Signed)
Anesthesia Post Note  Patient: Taylor Coffey  Procedure(s) Performed: ANTERIOR CERVICAL DECOMPRESSION/DISCECTOMY FUSION, INTERBODY PROSTHESIS, PLATE/SCREWS CERVICLA THREE- CERVICAL FOUR, CERVICAL FOUR- CERVICAL FIVE; EXPLORE FUSION WITH POSSIBLE HARDWARE REMOVAL (N/A Spine Cervical)     Patient location during evaluation: PACU Anesthesia Type: General Level of consciousness: awake and alert Pain management: pain level controlled Vital Signs Assessment: post-procedure vital signs reviewed and stable Respiratory status: spontaneous breathing, nonlabored ventilation and respiratory function stable Cardiovascular status: blood pressure returned to baseline and stable Postop Assessment: no apparent nausea or vomiting Anesthetic complications: no    Last Vitals:  Vitals:   03/07/19 1235 03/07/19 1250  BP: 106/82 107/75  Pulse: 85 87  Resp: 17 18  Temp: 37 C 36.6 C  SpO2: 96%     Last Pain:  Vitals:   03/07/19 1250  TempSrc: Oral  PainSc:                  Cecile Hearing

## 2019-03-07 NOTE — H&P (Addendum)
Subjective: The patient is a 39 year old white male on whom Dr. Bevely Palmer performed a C5-6 and C6-7 anterior cervical discectomy, fusion and plating in 2017.  He has developed recurrent neck and bilateral shoulder and arm pain, numbness weakness, etc.  He failed medical management and was worked up with a cervical MRI which demonstrated significant spondylosis and stenosis at C3-4 and C4-5.  I discussed the various treatment options with him.  He has decided to proceed with surgery.  Past Medical History:  Diagnosis Date  . Anxiety    with crowds of people  . Arthritis   . Complication of anesthesia 2011ish   Woke up with headache, and nausea  . COPD (chronic obstructive pulmonary disease) (HCC)   . Depression   . Head injury with loss of consciousness (HCC)   . History of kidney stones   . PONV (postoperative nausea and vomiting)    nausea  . Sleep apnea     Past Surgical History:  Procedure Laterality Date  . ANTERIOR CERVICAL DECOMP/DISCECTOMY FUSION N/A 02/14/2015   Procedure: Cervical five-six cervical six-seven  Anterior cervical decompression/diskectomy/fusion;  Surgeon: Loura Halt Ditty, MD;  Location: [facility];  Service: Neurosurgery;  Laterality: N/A;  . MULTIPLE TOOTH EXTRACTIONS      Allergies  Allergen Reactions  . Vicodin [Hydrocodone-Acetaminophen] Itching  . Amoxicillin Itching and Rash    Did it involve swelling of the face/tongue/throat, SOB, or low BP? No Did it involve sudden or severe rash/hives, skin peeling, or any reaction on the inside of your mouth or nose? No Did you need to seek medical attention at a hospital or doctor's office? No When did it last happen?20's If all above answers are "NO", may proceed with cephalosporin use.     Social History   Tobacco Use  . Smoking status: Current Every Day Smoker    Packs/day: 1.50    Years: 20.00    Pack years: 30.00  . Smokeless tobacco: Never Used  Substance Use Topics  . Alcohol use: No     History reviewed. No pertinent family history. Prior to Admission medications   Medication Sig Start Date End Date Taking? Authorizing Provider  diphenhydramine-acetaminophen (TYLENOL PM) 25-500 MG TABS tablet Take 4 tablets by mouth at bedtime.   Yes [provider]  DULoxetine (CYMBALTA) 20 MG capsule Take 20 mg by mouth daily. 01/25/19  Yes [provider]  gabapentin (NEURONTIN) 400 MG capsule Take 800 mg by mouth 3 (three) times daily.  12/22/14  Yes [provider]  methocarbamol (ROBAXIN) 750 MG tablet Take 1 tablet (750 mg total) by mouth 4 (four) times daily. Patient not taking: Reported on 02/24/2019 02/15/15   Ditty, Loura Halt, MD  oxyCODONE-acetaminophen (PERCOCET/ROXICET) 5-325 MG tablet Take 1-2 tablets by mouth every 4 (four) hours as needed for moderate pain. Patient not taking: Reported on 02/24/2019 02/15/15   Ditty, Loura Halt, MD     Review of Systems  Positive ROS: As above  All other systems have been reviewed and were otherwise negative with the exception of those mentioned in the HPI and as above.  Objective: Vital signs in last 24 hours: Temp:  [97.8 F (36.6 C)] 97.8 F (36.6 C) (02/15 0545) Pulse Rate:  [74] 74 (02/15 0545) Resp:  [18] 18 (02/15 0545) BP: (125)/(83) 125/83 (02/15 0545) SpO2:  [99 %] 99 % (02/15 0545) Weight:  [106.1 kg] 106.1 kg (02/15 0545) Estimated body mass index is 38.94 kg/m as calculated from the following:  Height as of this encounter: 5\' 5"  (1.651 m).   Weight as of this encounter: 106.1 kg.   General Appearance: Alert Head: Normocephalic, without obvious abnormality, atraumatic Eyes: PERRL, conjunctiva/corneas clear, EOM's intact,    Ears: Normal  Throat: Normal  Neck: Supple, Back: unremarkable Lungs: Clear to auscultation bilaterally, respirations unlabored Heart: Regular rate and rhythm, no murmur, rub or gallop Abdomen: Soft, non-tender Extremities: Extremities normal, atraumatic, no  cyanosis or edema Skin: unremarkable  NEUROLOGIC:   Mental status: alert and oriented,Motor Exam -he has weakness in his bilateral shoulders Sensory Exam -numbness in his bilateral hands and feet Reflexes:  Coordination - grossly normal Gait - grossly normal Balance - grossly normal Cranial Nerves: I: smell Not tested  II: visual acuity  OS: Normal  OD: Normal   II: visual fields Full to confrontation  II: pupils Equal, round, reactive to light  III,VII: ptosis None  III,IV,VI: extraocular muscles  Full ROM  V: mastication Normal  V: facial light touch sensation  Normal  V,VII: corneal reflex  Present  VII: facial muscle function - upper  Normal  VII: facial muscle function - lower Normal  VIII: hearing Not tested  IX: soft palate elevation  Normal  IX,X: gag reflex Present  XI: trapezius strength  5/5  XI: sternocleidomastoid strength 5/5  XI: neck flexion strength  5/5  XII: tongue strength  Normal    Data Review Lab Results  Component Value Date   WBC 11.4 (H) 03/03/2019   HGB 15.2 03/03/2019   HCT 45.8 03/03/2019   MCV 93.7 03/03/2019   PLT 316 03/03/2019   No results found for: NA, K, CL, CO2, BUN, CREATININE, GLUCOSE No results found for: INR, PROTIME  Assessment/Plan: C3-4 and C4-5 spondylosis, stenosis, cervicalgia, cervical radiculopathy, cervical myelopathy: I have discussed the situation with the patient and reviewed his imaging studies with him.  We have discussed the various treatment options including surgery.  I have described the surgical treatment option of an exploration of his cervical fusion, possible removal of the old plate and a U8-8 and C4-5 anterior cervical discectomy, fusion and plating.  I have shown her surgical models.  I have given him a surgical pamphlet.  We have discussed the risks, benefits, alternatives, expected postop course, and likelihood of achieving our goals with surgery.  I have answered all his questions.  He has decided to  proceed with surgery.   Ophelia Charter 03/07/2019 7:28 AM

## 2019-03-07 NOTE — Progress Notes (Signed)
Pharmacy Antibiotic Note  Taylor Coffey is a 39 y.o. male admitted on 03/07/2019 for surgery. S/p spinal surgery today 03/07/19.  Pharmacy has been consulted for Vancomycin dosing. Has pre-vertebral JP drain.  He received 1500mg  Vancomycin IV @ 0630 2/15 preop dose.  Plan: Vancomycin 1000 mg IV Q 12 hrs. Goal AUC 400-550. Expected AUC: 476 SCr used: 0.94 Monitor clinical progress, renal function. Check steady state vancomycin trough per protocol if needed.     Height: 5\' 5"  (165.1 cm) Weight: 234 lb (106.1 kg) IBW/kg (Calculated) : 61.5  Temp (24hrs), Avg:97.9 F (36.6 C), Min:97.6 F (36.4 C), Max:98.6 F (37 C)  Recent Labs  Lab 03/03/19 1024 03/07/19 1631  WBC 11.4*  --   CREATININE  --  0.94    Estimated Creatinine Clearance: 116 mL/min (by C-G formula based on SCr of 0.94 mg/dL).    Allergies  Allergen Reactions  . Vicodin [Hydrocodone-Acetaminophen] Itching  . Amoxicillin Itching and Rash    Did it involve swelling of the face/tongue/throat, SOB, or low BP? No Did it involve sudden or severe rash/hives, skin peeling, or any reaction on the inside of your mouth or nose? No Did you need to seek medical attention at a hospital or doctor's office? No When did it last happen?20's If all above answers are "NO", may proceed with cephalosporin use.    Thank you for allowing pharmacy to be a part of this patient's care.  05/01/19, RPh Clinical Pharmacist  Please check AMION for all [facility] phone numbers After 10:00 PM, call Main Pharmacy [phone]  03/07/2019 6:05 PM

## 2019-03-08 ENCOUNTER — Encounter: Payer: Self-pay | Admitting: *Deleted

## 2019-03-25 DIAGNOSIS — E669 Obesity, unspecified: Secondary | ICD-10-CM | POA: Diagnosis not present

## 2019-03-25 DIAGNOSIS — Z6837 Body mass index (BMI) 37.0-37.9, adult: Secondary | ICD-10-CM | POA: Diagnosis not present

## 2019-03-25 DIAGNOSIS — F341 Dysthymic disorder: Secondary | ICD-10-CM | POA: Diagnosis not present

## 2019-03-25 DIAGNOSIS — E291 Testicular hypofunction: Secondary | ICD-10-CM | POA: Diagnosis not present

## 2019-03-25 DIAGNOSIS — G894 Chronic pain syndrome: Secondary | ICD-10-CM | POA: Diagnosis not present

## 2019-03-25 DIAGNOSIS — E7849 Other hyperlipidemia: Secondary | ICD-10-CM | POA: Diagnosis not present

## 2019-04-11 DIAGNOSIS — M4712 Other spondylosis with myelopathy, cervical region: Secondary | ICD-10-CM | POA: Diagnosis not present

## 2019-04-11 DIAGNOSIS — R030 Elevated blood-pressure reading, without diagnosis of hypertension: Secondary | ICD-10-CM | POA: Diagnosis not present

## 2019-04-11 DIAGNOSIS — Z6839 Body mass index (BMI) 39.0-39.9, adult: Secondary | ICD-10-CM | POA: Diagnosis not present

## 2019-04-22 DIAGNOSIS — Z6837 Body mass index (BMI) 37.0-37.9, adult: Secondary | ICD-10-CM | POA: Diagnosis not present

## 2019-04-22 DIAGNOSIS — Z0000 Encounter for general adult medical examination without abnormal findings: Secondary | ICD-10-CM | POA: Diagnosis not present

## 2019-04-22 DIAGNOSIS — F17200 Nicotine dependence, unspecified, uncomplicated: Secondary | ICD-10-CM | POA: Diagnosis not present

## 2019-04-22 DIAGNOSIS — Z9181 History of falling: Secondary | ICD-10-CM | POA: Diagnosis not present

## 2019-04-22 DIAGNOSIS — E669 Obesity, unspecified: Secondary | ICD-10-CM | POA: Diagnosis not present

## 2019-06-22 DIAGNOSIS — M4712 Other spondylosis with myelopathy, cervical region: Secondary | ICD-10-CM | POA: Diagnosis not present

## 2019-06-22 DIAGNOSIS — Z79899 Other long term (current) drug therapy: Secondary | ICD-10-CM | POA: Diagnosis not present

## 2019-06-22 DIAGNOSIS — Z981 Arthrodesis status: Secondary | ICD-10-CM | POA: Diagnosis not present

## 2019-06-22 DIAGNOSIS — F1120 Opioid dependence, uncomplicated: Secondary | ICD-10-CM | POA: Diagnosis not present

## 2019-06-22 DIAGNOSIS — M5412 Radiculopathy, cervical region: Secondary | ICD-10-CM | POA: Diagnosis not present

## 2019-06-22 DIAGNOSIS — Z79891 Long term (current) use of opiate analgesic: Secondary | ICD-10-CM | POA: Diagnosis not present

## 2019-06-22 DIAGNOSIS — M542 Cervicalgia: Secondary | ICD-10-CM | POA: Diagnosis not present

## 2019-06-22 DIAGNOSIS — Z5181 Encounter for therapeutic drug level monitoring: Secondary | ICD-10-CM | POA: Diagnosis not present

## 2019-07-19 DIAGNOSIS — M4712 Other spondylosis with myelopathy, cervical region: Secondary | ICD-10-CM | POA: Diagnosis not present

## 2019-07-21 DIAGNOSIS — M4722 Other spondylosis with radiculopathy, cervical region: Secondary | ICD-10-CM | POA: Diagnosis not present

## 2019-07-21 DIAGNOSIS — F1120 Opioid dependence, uncomplicated: Secondary | ICD-10-CM | POA: Diagnosis not present

## 2019-07-21 DIAGNOSIS — M4802 Spinal stenosis, cervical region: Secondary | ICD-10-CM | POA: Diagnosis not present

## 2019-09-28 DIAGNOSIS — M4802 Spinal stenosis, cervical region: Secondary | ICD-10-CM | POA: Diagnosis not present

## 2019-09-28 DIAGNOSIS — F1120 Opioid dependence, uncomplicated: Secondary | ICD-10-CM | POA: Diagnosis not present

## 2019-09-28 DIAGNOSIS — Z981 Arthrodesis status: Secondary | ICD-10-CM | POA: Diagnosis not present

## 2019-11-14 DIAGNOSIS — G4709 Other insomnia: Secondary | ICD-10-CM | POA: Diagnosis not present

## 2019-11-14 DIAGNOSIS — E782 Mixed hyperlipidemia: Secondary | ICD-10-CM | POA: Diagnosis not present

## 2019-11-14 DIAGNOSIS — F341 Dysthymic disorder: Secondary | ICD-10-CM | POA: Diagnosis not present

## 2019-11-14 DIAGNOSIS — Z6836 Body mass index (BMI) 36.0-36.9, adult: Secondary | ICD-10-CM | POA: Diagnosis not present

## 2019-11-14 DIAGNOSIS — E669 Obesity, unspecified: Secondary | ICD-10-CM | POA: Diagnosis not present

## 2019-11-14 DIAGNOSIS — R7309 Other abnormal glucose: Secondary | ICD-10-CM | POA: Diagnosis not present

## 2020-01-03 DIAGNOSIS — M5412 Radiculopathy, cervical region: Secondary | ICD-10-CM | POA: Diagnosis not present

## 2020-01-03 DIAGNOSIS — F1120 Opioid dependence, uncomplicated: Secondary | ICD-10-CM | POA: Diagnosis not present

## 2020-01-03 DIAGNOSIS — Z79891 Long term (current) use of opiate analgesic: Secondary | ICD-10-CM | POA: Diagnosis not present

## 2020-01-03 DIAGNOSIS — Z981 Arthrodesis status: Secondary | ICD-10-CM | POA: Diagnosis not present

## 2020-01-03 DIAGNOSIS — M4802 Spinal stenosis, cervical region: Secondary | ICD-10-CM | POA: Diagnosis not present

## 2020-01-24 DIAGNOSIS — R030 Elevated blood-pressure reading, without diagnosis of hypertension: Secondary | ICD-10-CM | POA: Diagnosis not present

## 2020-01-24 DIAGNOSIS — M4712 Other spondylosis with myelopathy, cervical region: Secondary | ICD-10-CM | POA: Diagnosis not present

## 2020-01-24 DIAGNOSIS — Z6838 Body mass index (BMI) 38.0-38.9, adult: Secondary | ICD-10-CM | POA: Diagnosis not present

## 2020-03-28 DIAGNOSIS — M47812 Spondylosis without myelopathy or radiculopathy, cervical region: Secondary | ICD-10-CM | POA: Diagnosis not present

## 2020-03-28 DIAGNOSIS — Z981 Arthrodesis status: Secondary | ICD-10-CM | POA: Diagnosis not present

## 2020-04-19 DIAGNOSIS — R062 Wheezing: Secondary | ICD-10-CM | POA: Diagnosis not present

## 2020-04-19 DIAGNOSIS — E782 Mixed hyperlipidemia: Secondary | ICD-10-CM | POA: Diagnosis not present

## 2020-04-19 DIAGNOSIS — Z0000 Encounter for general adult medical examination without abnormal findings: Secondary | ICD-10-CM | POA: Diagnosis not present

## 2020-04-19 DIAGNOSIS — F341 Dysthymic disorder: Secondary | ICD-10-CM | POA: Diagnosis not present

## 2020-04-19 DIAGNOSIS — E669 Obesity, unspecified: Secondary | ICD-10-CM | POA: Diagnosis not present

## 2020-04-19 DIAGNOSIS — G4709 Other insomnia: Secondary | ICD-10-CM | POA: Diagnosis not present

## 2020-04-19 DIAGNOSIS — Z6837 Body mass index (BMI) 37.0-37.9, adult: Secondary | ICD-10-CM | POA: Diagnosis not present

## 2020-04-19 DIAGNOSIS — F17200 Nicotine dependence, unspecified, uncomplicated: Secondary | ICD-10-CM | POA: Diagnosis not present

## 2020-04-19 DIAGNOSIS — Z9181 History of falling: Secondary | ICD-10-CM | POA: Diagnosis not present

## 2020-05-15 DIAGNOSIS — J449 Chronic obstructive pulmonary disease, unspecified: Secondary | ICD-10-CM | POA: Diagnosis not present

## 2020-05-15 DIAGNOSIS — K2100 Gastro-esophageal reflux disease with esophagitis, without bleeding: Secondary | ICD-10-CM | POA: Diagnosis not present

## 2020-05-15 DIAGNOSIS — M940 Chondrocostal junction syndrome [Tietze]: Secondary | ICD-10-CM | POA: Diagnosis not present

## 2020-06-27 DIAGNOSIS — F1120 Opioid dependence, uncomplicated: Secondary | ICD-10-CM | POA: Diagnosis not present

## 2020-06-27 DIAGNOSIS — M4712 Other spondylosis with myelopathy, cervical region: Secondary | ICD-10-CM | POA: Diagnosis not present

## 2020-06-27 DIAGNOSIS — Z981 Arthrodesis status: Secondary | ICD-10-CM | POA: Diagnosis not present

## 2020-06-27 DIAGNOSIS — M5416 Radiculopathy, lumbar region: Secondary | ICD-10-CM | POA: Diagnosis not present

## 2020-09-19 DIAGNOSIS — G894 Chronic pain syndrome: Secondary | ICD-10-CM | POA: Diagnosis not present

## 2020-09-19 DIAGNOSIS — E782 Mixed hyperlipidemia: Secondary | ICD-10-CM | POA: Diagnosis not present

## 2020-09-19 DIAGNOSIS — K2100 Gastro-esophageal reflux disease with esophagitis, without bleeding: Secondary | ICD-10-CM | POA: Diagnosis not present

## 2020-09-19 DIAGNOSIS — F17200 Nicotine dependence, unspecified, uncomplicated: Secondary | ICD-10-CM | POA: Diagnosis not present

## 2020-09-26 DIAGNOSIS — Z981 Arthrodesis status: Secondary | ICD-10-CM | POA: Diagnosis not present

## 2020-09-26 DIAGNOSIS — F1120 Opioid dependence, uncomplicated: Secondary | ICD-10-CM | POA: Diagnosis not present

## 2020-09-26 DIAGNOSIS — M47812 Spondylosis without myelopathy or radiculopathy, cervical region: Secondary | ICD-10-CM | POA: Diagnosis not present

## 2020-11-09 DIAGNOSIS — R0602 Shortness of breath: Secondary | ICD-10-CM | POA: Diagnosis not present

## 2020-11-09 DIAGNOSIS — R059 Cough, unspecified: Secondary | ICD-10-CM | POA: Diagnosis not present

## 2020-11-09 DIAGNOSIS — J441 Chronic obstructive pulmonary disease with (acute) exacerbation: Secondary | ICD-10-CM | POA: Diagnosis not present

## 2020-11-19 DIAGNOSIS — E782 Mixed hyperlipidemia: Secondary | ICD-10-CM | POA: Diagnosis not present

## 2020-11-19 DIAGNOSIS — G894 Chronic pain syndrome: Secondary | ICD-10-CM | POA: Diagnosis not present

## 2020-11-19 DIAGNOSIS — R7301 Impaired fasting glucose: Secondary | ICD-10-CM | POA: Diagnosis not present

## 2020-11-19 DIAGNOSIS — E669 Obesity, unspecified: Secondary | ICD-10-CM | POA: Diagnosis not present

## 2020-11-19 DIAGNOSIS — E7800 Pure hypercholesterolemia, unspecified: Secondary | ICD-10-CM | POA: Diagnosis not present

## 2020-11-19 DIAGNOSIS — Z2821 Immunization not carried out because of patient refusal: Secondary | ICD-10-CM | POA: Diagnosis not present

## 2020-11-19 DIAGNOSIS — Z6836 Body mass index (BMI) 36.0-36.9, adult: Secondary | ICD-10-CM | POA: Diagnosis not present

## 2020-11-19 DIAGNOSIS — F321 Major depressive disorder, single episode, moderate: Secondary | ICD-10-CM | POA: Diagnosis not present

## 2020-12-26 DIAGNOSIS — M47812 Spondylosis without myelopathy or radiculopathy, cervical region: Secondary | ICD-10-CM | POA: Diagnosis not present

## 2020-12-26 DIAGNOSIS — F1120 Opioid dependence, uncomplicated: Secondary | ICD-10-CM | POA: Diagnosis not present

## 2020-12-26 DIAGNOSIS — M542 Cervicalgia: Secondary | ICD-10-CM | POA: Diagnosis not present

## 2020-12-26 DIAGNOSIS — Z981 Arthrodesis status: Secondary | ICD-10-CM | POA: Diagnosis not present

## 2021-03-14 DIAGNOSIS — M542 Cervicalgia: Secondary | ICD-10-CM | POA: Diagnosis not present

## 2021-03-14 DIAGNOSIS — Z981 Arthrodesis status: Secondary | ICD-10-CM | POA: Diagnosis not present

## 2021-03-14 DIAGNOSIS — F1120 Opioid dependence, uncomplicated: Secondary | ICD-10-CM | POA: Diagnosis not present

## 2021-05-23 DIAGNOSIS — G894 Chronic pain syndrome: Secondary | ICD-10-CM | POA: Diagnosis not present

## 2021-05-23 DIAGNOSIS — E782 Mixed hyperlipidemia: Secondary | ICD-10-CM | POA: Diagnosis not present

## 2021-05-23 DIAGNOSIS — G4709 Other insomnia: Secondary | ICD-10-CM | POA: Diagnosis not present

## 2021-05-23 DIAGNOSIS — F321 Major depressive disorder, single episode, moderate: Secondary | ICD-10-CM | POA: Diagnosis not present

## 2021-05-23 DIAGNOSIS — J449 Chronic obstructive pulmonary disease, unspecified: Secondary | ICD-10-CM | POA: Diagnosis not present

## 2021-05-23 DIAGNOSIS — Z6838 Body mass index (BMI) 38.0-38.9, adult: Secondary | ICD-10-CM | POA: Diagnosis not present

## 2021-05-23 DIAGNOSIS — E669 Obesity, unspecified: Secondary | ICD-10-CM | POA: Diagnosis not present

## 2021-05-27 IMAGING — CR DG CERVICAL SPINE 1V
1 series · 1 of 1 positions shown · non-contrast
Comparison: 01/27/2019

CLINICAL DATA: Cervical fusion.

EXAM:
DG CERVICAL SPINE - 1 VIEW

[xtable]
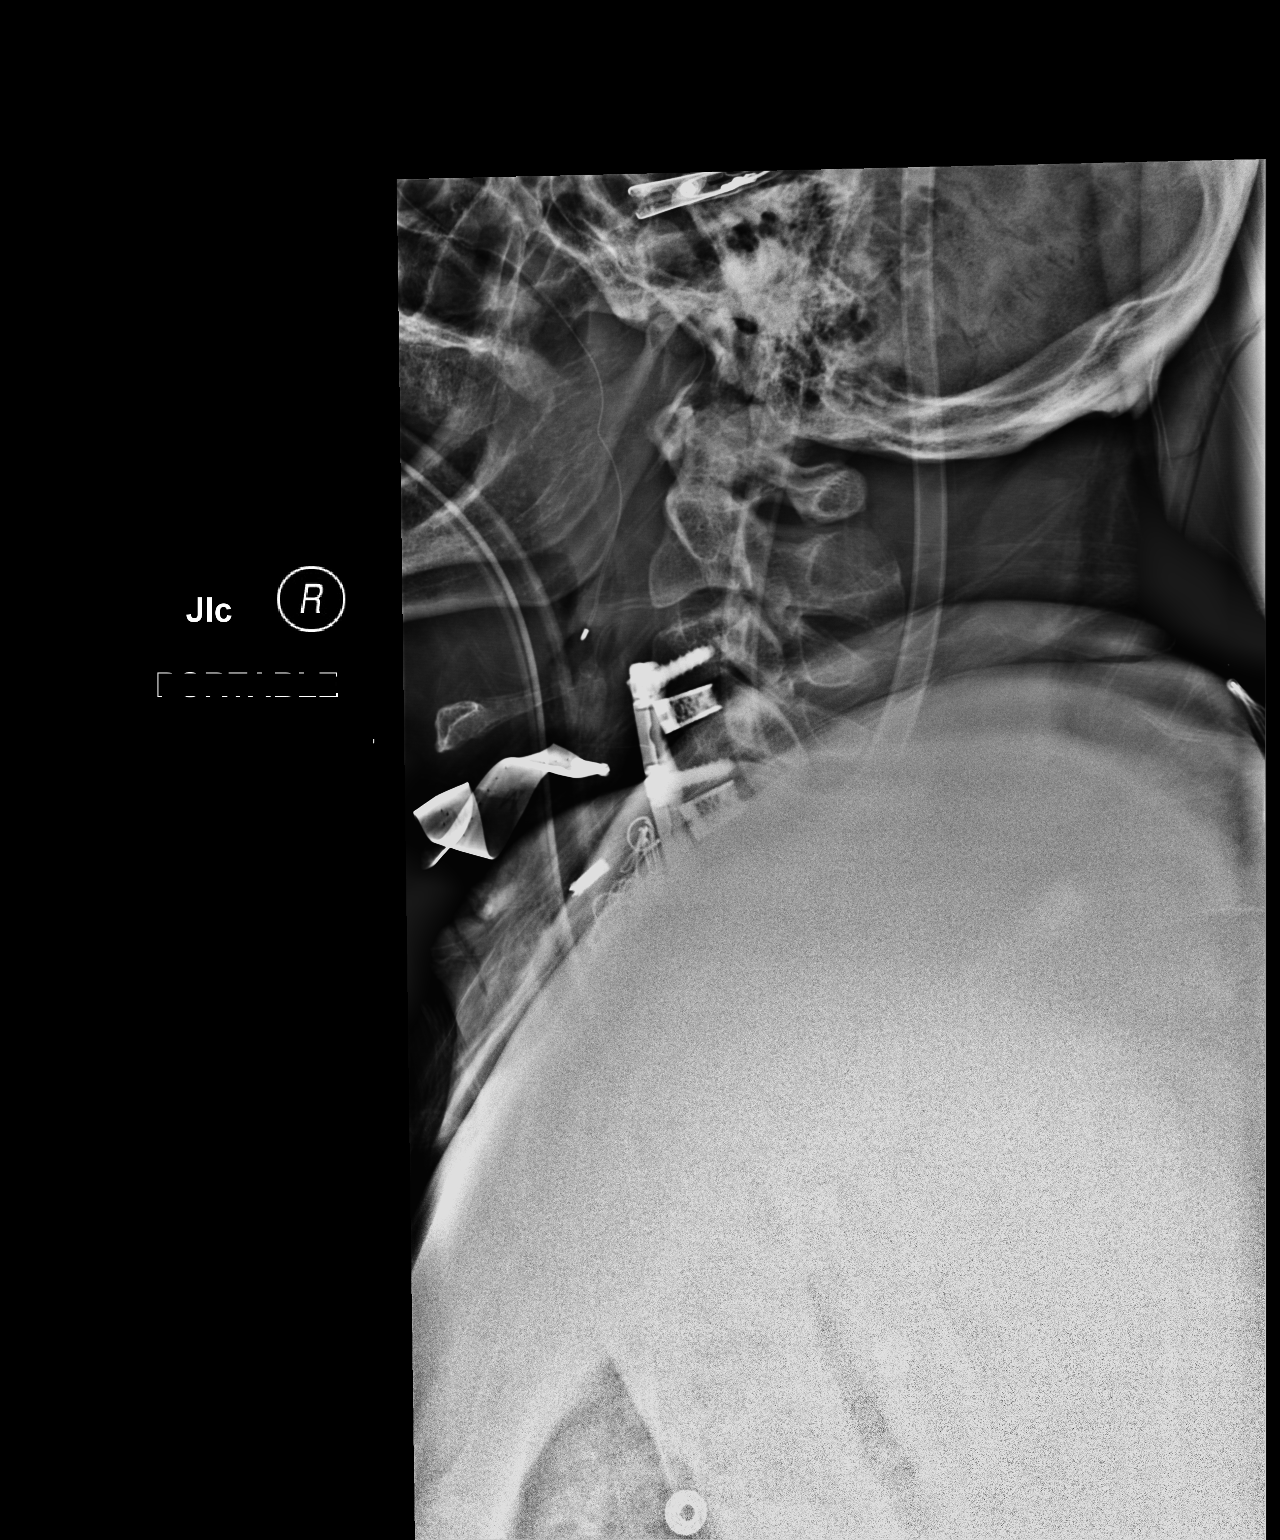

[1 of 1 positions shown; findings below may reference images not displayed]

FINDINGS: New anterior and interbody fusion hardware at C3-4 and C4-5. Good
position and alignment without complicating features. Prior hardware
at C5-6 and C6-7 is difficult to identify.
IMPRESSION: New anterior and interbody fusion hardware at C3-4 and C4-5.

## 2021-06-03 DIAGNOSIS — M5412 Radiculopathy, cervical region: Secondary | ICD-10-CM | POA: Diagnosis not present

## 2021-06-03 DIAGNOSIS — Z6839 Body mass index (BMI) 39.0-39.9, adult: Secondary | ICD-10-CM | POA: Diagnosis not present

## 2021-06-03 DIAGNOSIS — M542 Cervicalgia: Secondary | ICD-10-CM | POA: Diagnosis not present

## 2021-06-03 DIAGNOSIS — M47812 Spondylosis without myelopathy or radiculopathy, cervical region: Secondary | ICD-10-CM | POA: Diagnosis not present

## 2021-06-03 DIAGNOSIS — F1120 Opioid dependence, uncomplicated: Secondary | ICD-10-CM | POA: Diagnosis not present

## 2021-06-06 DIAGNOSIS — M542 Cervicalgia: Secondary | ICD-10-CM | POA: Diagnosis not present

## 2021-06-06 DIAGNOSIS — R200 Anesthesia of skin: Secondary | ICD-10-CM | POA: Diagnosis not present

## 2021-06-06 DIAGNOSIS — M5412 Radiculopathy, cervical region: Secondary | ICD-10-CM | POA: Diagnosis not present

## 2021-06-06 DIAGNOSIS — R202 Paresthesia of skin: Secondary | ICD-10-CM | POA: Diagnosis not present

## 2021-08-27 DIAGNOSIS — F1120 Opioid dependence, uncomplicated: Secondary | ICD-10-CM | POA: Diagnosis not present

## 2021-08-27 DIAGNOSIS — M5412 Radiculopathy, cervical region: Secondary | ICD-10-CM | POA: Diagnosis not present

## 2021-08-27 DIAGNOSIS — M47812 Spondylosis without myelopathy or radiculopathy, cervical region: Secondary | ICD-10-CM | POA: Diagnosis not present

## 2021-09-16 DIAGNOSIS — S40262A Insect bite (nonvenomous) of left shoulder, initial encounter: Secondary | ICD-10-CM | POA: Diagnosis not present

## 2021-11-19 DIAGNOSIS — M542 Cervicalgia: Secondary | ICD-10-CM | POA: Diagnosis not present

## 2021-11-19 DIAGNOSIS — F1120 Opioid dependence, uncomplicated: Secondary | ICD-10-CM | POA: Diagnosis not present

## 2021-11-19 DIAGNOSIS — M5412 Radiculopathy, cervical region: Secondary | ICD-10-CM | POA: Diagnosis not present

## 2021-12-10 DIAGNOSIS — E669 Obesity, unspecified: Secondary | ICD-10-CM | POA: Diagnosis not present

## 2021-12-10 DIAGNOSIS — E7849 Other hyperlipidemia: Secondary | ICD-10-CM | POA: Diagnosis not present

## 2021-12-10 DIAGNOSIS — G894 Chronic pain syndrome: Secondary | ICD-10-CM | POA: Diagnosis not present

## 2021-12-10 DIAGNOSIS — Z1331 Encounter for screening for depression: Secondary | ICD-10-CM | POA: Diagnosis not present

## 2021-12-10 DIAGNOSIS — Z0000 Encounter for general adult medical examination without abnormal findings: Secondary | ICD-10-CM | POA: Diagnosis not present

## 2021-12-10 DIAGNOSIS — Z6834 Body mass index (BMI) 34.0-34.9, adult: Secondary | ICD-10-CM | POA: Diagnosis not present

## 2021-12-10 DIAGNOSIS — G4709 Other insomnia: Secondary | ICD-10-CM | POA: Diagnosis not present

## 2021-12-10 DIAGNOSIS — F325 Major depressive disorder, single episode, in full remission: Secondary | ICD-10-CM | POA: Diagnosis not present

## 2021-12-10 DIAGNOSIS — Z1389 Encounter for screening for other disorder: Secondary | ICD-10-CM | POA: Diagnosis not present

## 2022-02-27 DIAGNOSIS — Z6833 Body mass index (BMI) 33.0-33.9, adult: Secondary | ICD-10-CM | POA: Diagnosis not present

## 2022-02-27 DIAGNOSIS — M5412 Radiculopathy, cervical region: Secondary | ICD-10-CM | POA: Diagnosis not present

## 2022-02-27 DIAGNOSIS — M542 Cervicalgia: Secondary | ICD-10-CM | POA: Diagnosis not present

## 2022-02-27 DIAGNOSIS — F1120 Opioid dependence, uncomplicated: Secondary | ICD-10-CM | POA: Diagnosis not present

## 2022-03-31 DIAGNOSIS — Z118 Encounter for screening for other infectious and parasitic diseases: Secondary | ICD-10-CM | POA: Diagnosis not present

## 2022-03-31 DIAGNOSIS — Z113 Encounter for screening for infections with a predominantly sexual mode of transmission: Secondary | ICD-10-CM | POA: Diagnosis not present

## 2022-05-27 DIAGNOSIS — F1120 Opioid dependence, uncomplicated: Secondary | ICD-10-CM | POA: Diagnosis not present

## 2022-05-27 DIAGNOSIS — M542 Cervicalgia: Secondary | ICD-10-CM | POA: Diagnosis not present

## 2022-05-27 DIAGNOSIS — M5412 Radiculopathy, cervical region: Secondary | ICD-10-CM | POA: Diagnosis not present

## 2022-05-27 DIAGNOSIS — Z6833 Body mass index (BMI) 33.0-33.9, adult: Secondary | ICD-10-CM | POA: Diagnosis not present

## 2022-07-13 DIAGNOSIS — W19XXXA Unspecified fall, initial encounter: Secondary | ICD-10-CM | POA: Diagnosis not present

## 2022-07-13 DIAGNOSIS — M7989 Other specified soft tissue disorders: Secondary | ICD-10-CM | POA: Diagnosis not present

## 2022-07-13 DIAGNOSIS — S62346A Nondisplaced fracture of base of fifth metacarpal bone, right hand, initial encounter for closed fracture: Secondary | ICD-10-CM | POA: Diagnosis not present

## 2022-07-17 DIAGNOSIS — S62346A Nondisplaced fracture of base of fifth metacarpal bone, right hand, initial encounter for closed fracture: Secondary | ICD-10-CM | POA: Diagnosis not present

## 2022-07-31 DIAGNOSIS — E669 Obesity, unspecified: Secondary | ICD-10-CM | POA: Diagnosis not present

## 2022-07-31 DIAGNOSIS — Z6831 Body mass index (BMI) 31.0-31.9, adult: Secondary | ICD-10-CM | POA: Diagnosis not present

## 2022-07-31 DIAGNOSIS — E785 Hyperlipidemia, unspecified: Secondary | ICD-10-CM | POA: Diagnosis not present

## 2022-07-31 DIAGNOSIS — J449 Chronic obstructive pulmonary disease, unspecified: Secondary | ICD-10-CM | POA: Diagnosis not present

## 2022-07-31 DIAGNOSIS — G894 Chronic pain syndrome: Secondary | ICD-10-CM | POA: Diagnosis not present

## 2022-08-18 DIAGNOSIS — S62346A Nondisplaced fracture of base of fifth metacarpal bone, right hand, initial encounter for closed fracture: Secondary | ICD-10-CM | POA: Diagnosis not present

## 2022-08-27 DIAGNOSIS — M542 Cervicalgia: Secondary | ICD-10-CM | POA: Diagnosis not present

## 2022-08-27 DIAGNOSIS — M5412 Radiculopathy, cervical region: Secondary | ICD-10-CM | POA: Diagnosis not present

## 2022-08-27 DIAGNOSIS — F1120 Opioid dependence, uncomplicated: Secondary | ICD-10-CM | POA: Diagnosis not present

## 2022-08-27 DIAGNOSIS — Z6832 Body mass index (BMI) 32.0-32.9, adult: Secondary | ICD-10-CM | POA: Diagnosis not present

## 2022-09-02 DIAGNOSIS — M5021 Other cervical disc displacement,  high cervical region: Secondary | ICD-10-CM | POA: Diagnosis not present

## 2022-09-02 DIAGNOSIS — M5412 Radiculopathy, cervical region: Secondary | ICD-10-CM | POA: Diagnosis not present

## 2022-09-02 DIAGNOSIS — R200 Anesthesia of skin: Secondary | ICD-10-CM | POA: Diagnosis not present

## 2022-09-02 DIAGNOSIS — M4802 Spinal stenosis, cervical region: Secondary | ICD-10-CM | POA: Diagnosis not present

## 2022-09-02 DIAGNOSIS — Z981 Arthrodesis status: Secondary | ICD-10-CM | POA: Diagnosis not present

## 2022-09-05 DIAGNOSIS — R1031 Right lower quadrant pain: Secondary | ICD-10-CM | POA: Diagnosis not present

## 2022-09-05 DIAGNOSIS — R509 Fever, unspecified: Secondary | ICD-10-CM | POA: Diagnosis not present

## 2022-09-05 DIAGNOSIS — N200 Calculus of kidney: Secondary | ICD-10-CM | POA: Diagnosis not present

## 2022-09-05 DIAGNOSIS — M25561 Pain in right knee: Secondary | ICD-10-CM | POA: Diagnosis not present

## 2022-09-05 DIAGNOSIS — F17210 Nicotine dependence, cigarettes, uncomplicated: Secondary | ICD-10-CM | POA: Diagnosis not present

## 2022-09-05 DIAGNOSIS — M25511 Pain in right shoulder: Secondary | ICD-10-CM | POA: Diagnosis not present

## 2022-09-05 DIAGNOSIS — R300 Dysuria: Secondary | ICD-10-CM | POA: Diagnosis not present

## 2022-09-05 DIAGNOSIS — K5730 Diverticulosis of large intestine without perforation or abscess without bleeding: Secondary | ICD-10-CM | POA: Diagnosis not present

## 2022-09-05 DIAGNOSIS — I700 Atherosclerosis of aorta: Secondary | ICD-10-CM | POA: Diagnosis not present

## 2022-09-05 DIAGNOSIS — D72829 Elevated white blood cell count, unspecified: Secondary | ICD-10-CM | POA: Diagnosis not present

## 2022-09-05 DIAGNOSIS — L089 Local infection of the skin and subcutaneous tissue, unspecified: Secondary | ICD-10-CM | POA: Diagnosis not present

## 2022-09-05 DIAGNOSIS — G4730 Sleep apnea, unspecified: Secondary | ICD-10-CM | POA: Diagnosis not present

## 2022-09-15 DIAGNOSIS — S62346A Nondisplaced fracture of base of fifth metacarpal bone, right hand, initial encounter for closed fracture: Secondary | ICD-10-CM | POA: Diagnosis not present

## 2022-10-21 DIAGNOSIS — M542 Cervicalgia: Secondary | ICD-10-CM | POA: Diagnosis not present

## 2022-10-21 DIAGNOSIS — G959 Disease of spinal cord, unspecified: Secondary | ICD-10-CM | POA: Diagnosis not present

## 2022-10-30 ENCOUNTER — Other Ambulatory Visit: Payer: Self-pay | Admitting: Neurosurgery

## 2022-10-30 DIAGNOSIS — G959 Disease of spinal cord, unspecified: Secondary | ICD-10-CM

## 2022-11-06 NOTE — Discharge Instructions (Signed)

## 2022-11-07 ENCOUNTER — Ambulatory Visit
Admission: RE | Admit: 2022-11-07 | Discharge: 2022-11-07 | Disposition: A | Discharge status: Home / Self Care | Payer: Medicare HMO | Source: Ambulatory Visit | Attending: Neurosurgery | Admitting: Neurosurgery

## 2022-11-07 DIAGNOSIS — Z981 Arthrodesis status: Secondary | ICD-10-CM | POA: Diagnosis not present

## 2022-11-07 DIAGNOSIS — M4802 Spinal stenosis, cervical region: Secondary | ICD-10-CM | POA: Diagnosis not present

## 2022-11-07 DIAGNOSIS — G959 Disease of spinal cord, unspecified: Secondary | ICD-10-CM

## 2022-11-07 DIAGNOSIS — M4312 Spondylolisthesis, cervical region: Secondary | ICD-10-CM | POA: Diagnosis not present

## 2022-11-07 MED ORDER — ONDANSETRON HCL 4 MG/2ML IJ SOLN: 4.0000 mg | Freq: Once | INTRAMUSCULAR | Status: DC | PRN

## 2022-11-07 MED ORDER — DIAZEPAM 5 MG PO TABS
10.0000 mg | ORAL_TABLET | Freq: Once | ORAL | Status: AC
  Administered 2022-11-07: 10 mg via ORAL

## 2022-11-07 MED ORDER — MEPERIDINE HCL 50 MG/ML IJ SOLN: 50.0000 mg | Freq: Once | INTRAMUSCULAR | Status: DC | PRN

## 2022-11-07 MED ORDER — IOPAMIDOL (ISOVUE-M 300) INJECTION 61%
10.0000 mL | Freq: Once | INTRAMUSCULAR | Status: AC
  Administered 2022-11-07: 10 mL via INTRATHECAL

## 2022-11-10 DIAGNOSIS — Z0000 Encounter for general adult medical examination without abnormal findings: Secondary | ICD-10-CM | POA: Diagnosis not present

## 2022-11-10 DIAGNOSIS — F17200 Nicotine dependence, unspecified, uncomplicated: Secondary | ICD-10-CM | POA: Diagnosis not present

## 2022-11-10 DIAGNOSIS — J449 Chronic obstructive pulmonary disease, unspecified: Secondary | ICD-10-CM | POA: Diagnosis not present

## 2022-11-10 DIAGNOSIS — G894 Chronic pain syndrome: Secondary | ICD-10-CM | POA: Diagnosis not present

## 2022-11-10 DIAGNOSIS — E7849 Other hyperlipidemia: Secondary | ICD-10-CM | POA: Diagnosis not present

## 2022-11-10 DIAGNOSIS — G4700 Insomnia, unspecified: Secondary | ICD-10-CM | POA: Diagnosis not present

## 2022-11-10 DIAGNOSIS — Z2821 Immunization not carried out because of patient refusal: Secondary | ICD-10-CM | POA: Diagnosis not present

## 2022-11-10 DIAGNOSIS — Z9181 History of falling: Secondary | ICD-10-CM | POA: Diagnosis not present

## 2022-11-10 DIAGNOSIS — Z1389 Encounter for screening for other disorder: Secondary | ICD-10-CM | POA: Diagnosis not present

## 2022-11-10 DIAGNOSIS — R7301 Impaired fasting glucose: Secondary | ICD-10-CM | POA: Diagnosis not present

## 2022-11-18 DIAGNOSIS — S129XXS Fracture of neck, unspecified, sequela: Secondary | ICD-10-CM | POA: Diagnosis not present

## 2022-11-18 DIAGNOSIS — M542 Cervicalgia: Secondary | ICD-10-CM | POA: Diagnosis not present

## 2022-11-27 DIAGNOSIS — F1120 Opioid dependence, uncomplicated: Secondary | ICD-10-CM | POA: Diagnosis not present

## 2022-11-27 DIAGNOSIS — Z6834 Body mass index (BMI) 34.0-34.9, adult: Secondary | ICD-10-CM | POA: Diagnosis not present

## 2022-11-27 DIAGNOSIS — M542 Cervicalgia: Secondary | ICD-10-CM | POA: Diagnosis not present

## 2022-12-03 DIAGNOSIS — M542 Cervicalgia: Secondary | ICD-10-CM | POA: Diagnosis not present

## 2023-02-17 DIAGNOSIS — S129XXA Fracture of neck, unspecified, initial encounter: Secondary | ICD-10-CM | POA: Diagnosis not present

## 2023-02-17 DIAGNOSIS — M542 Cervicalgia: Secondary | ICD-10-CM | POA: Diagnosis not present

## 2023-02-17 DIAGNOSIS — S129XXS Fracture of neck, unspecified, sequela: Secondary | ICD-10-CM | POA: Diagnosis not present

## 2023-02-27 DIAGNOSIS — M542 Cervicalgia: Secondary | ICD-10-CM | POA: Diagnosis not present

## 2023-02-27 DIAGNOSIS — S129XXS Fracture of neck, unspecified, sequela: Secondary | ICD-10-CM | POA: Diagnosis not present

## 2023-02-27 DIAGNOSIS — F1120 Opioid dependence, uncomplicated: Secondary | ICD-10-CM | POA: Diagnosis not present

## 2023-04-16 DIAGNOSIS — J449 Chronic obstructive pulmonary disease, unspecified: Secondary | ICD-10-CM | POA: Diagnosis not present

## 2023-04-16 DIAGNOSIS — Z6834 Body mass index (BMI) 34.0-34.9, adult: Secondary | ICD-10-CM | POA: Diagnosis not present

## 2023-04-16 DIAGNOSIS — K2100 Gastro-esophageal reflux disease with esophagitis, without bleeding: Secondary | ICD-10-CM | POA: Diagnosis not present

## 2023-04-16 DIAGNOSIS — G894 Chronic pain syndrome: Secondary | ICD-10-CM | POA: Diagnosis not present

## 2023-04-16 DIAGNOSIS — E669 Obesity, unspecified: Secondary | ICD-10-CM | POA: Diagnosis not present

## 2023-04-16 DIAGNOSIS — E782 Mixed hyperlipidemia: Secondary | ICD-10-CM | POA: Diagnosis not present

## 2023-05-29 DIAGNOSIS — S129XXS Fracture of neck, unspecified, sequela: Secondary | ICD-10-CM | POA: Diagnosis not present

## 2023-05-29 DIAGNOSIS — F1120 Opioid dependence, uncomplicated: Secondary | ICD-10-CM | POA: Diagnosis not present

## 2023-05-29 DIAGNOSIS — M542 Cervicalgia: Secondary | ICD-10-CM | POA: Diagnosis not present

## 2023-08-28 DIAGNOSIS — S129XXS Fracture of neck, unspecified, sequela: Secondary | ICD-10-CM | POA: Diagnosis not present

## 2023-08-28 DIAGNOSIS — F1120 Opioid dependence, uncomplicated: Secondary | ICD-10-CM | POA: Diagnosis not present

## 2023-08-28 DIAGNOSIS — M542 Cervicalgia: Secondary | ICD-10-CM | POA: Diagnosis not present

## 2023-09-08 DIAGNOSIS — S129XXS Fracture of neck, unspecified, sequela: Secondary | ICD-10-CM | POA: Diagnosis not present

## 2023-09-08 DIAGNOSIS — Z6836 Body mass index (BMI) 36.0-36.9, adult: Secondary | ICD-10-CM | POA: Diagnosis not present

## 2023-09-08 DIAGNOSIS — M542 Cervicalgia: Secondary | ICD-10-CM | POA: Diagnosis not present

## 2023-09-11 ENCOUNTER — Other Ambulatory Visit: Payer: Self-pay | Admitting: Neurosurgery

## 2023-10-08 NOTE — Progress Notes (Signed)
 Surgical Instructions   Your procedure is scheduled on Monday October 12, 2023. Report to [facility] Main Entrance A at 8:30 A.M., then check in with the Admitting office. Any questions or running late day of surgery: call [phone]  Questions prior to your surgery date: call [phone], Monday-Friday, 8am-4pm. If you experience any cold or flu symptoms such as cough, fever, chills, shortness of breath, etc. between now and your scheduled surgery, please notify us  at the above number.     Remember:  Do not eat or drink after midnight the night before your surgery    Take these medicines the morning of surgery with A SIP OF WATER  diphenhydramine-acetaminophen  (TYLENOL  PM)  DULoxetine  (CYMBALTA )  gabapentin  (NEURONTIN )   May take these medicines IF NEEDED: cyclobenzaprine  (FLEXERIL )  oxyCODONE -acetaminophen  (PERCOCET/ROXICET)   One week prior to surgery, STOP taking any Aspirin (unless otherwise instructed by your surgeon) Aleve, Naproxen, Ibuprofen, Motrin, Advil, Goody's, BC's, all herbal medications, fish oil, and non-prescription vitamins.                     Do NOT Smoke (Tobacco/Vaping) for 24 hours prior to your procedure.  If you use a CPAP at night, you may bring your mask/headgear for your overnight stay.   You will be asked to remove any contacts, glasses, piercing's, hearing aid's, dentures/partials prior to surgery. Please bring cases for these items if needed.    Patients discharged the day of surgery will not be allowed to drive home, and someone needs to stay with them for 24 hours.  SURGICAL WAITING ROOM VISITATION Patients may have no more than 2 support people in the waiting area - these visitors may rotate.   Pre-op nurse will coordinate an appropriate time for 1 ADULT support person, who may not rotate, to accompany patient in pre-op.  Children under the age of 83 must have an adult with them who is not the patient and must remain in the main waiting  area with an adult.  If the patient needs to stay at the hospital during part of their recovery, the visitor guidelines for inpatient rooms apply.  Please refer to the [facility] website for the visitor guidelines for any additional information.   If you received a COVID test during your pre-op visit  it is requested that you wear a mask when out in public, stay away from anyone that may not be feeling well and notify your surgeon if you develop symptoms. If you have been in contact with anyone that has tested positive in the last 10 days please notify you surgeon.      Pre-operative 5 CHG Bathing Instructions   You can play a key role in reducing the risk of infection after surgery. Your skin needs to be as free of germs as possible. You can reduce the number of germs on your skin by washing with CHG (chlorhexidine  gluconate) soap before surgery. CHG is an antiseptic soap that kills germs and continues to kill germs even after washing.   DO NOT use if you have an allergy to chlorhexidine /CHG or antibacterial soaps. If your skin becomes reddened or irritated, stop using the CHG and notify one of our RNs at [phone].   Please shower with the CHG soap starting 4 days before surgery using the following schedule:     Please keep in mind the following:  DO NOT shave, including legs and underarms, starting the day of your first shower.   You may shave  your face at any point before/day of surgery.  Place clean sheets on your bed the day you start using CHG soap. Use a clean washcloth (not used since being washed) for each shower. DO NOT sleep with pets once you start using the CHG.   CHG Shower Instructions:  Wash your face and private area with normal soap. If you choose to wash your hair, wash first with your normal shampoo.  After you use shampoo/soap, rinse your hair and body thoroughly to remove shampoo/soap residue.  Turn the water OFF and apply about 3 tablespoons (45 ml) of CHG  soap to a CLEAN washcloth.  Apply CHG soap ONLY FROM YOUR NECK DOWN TO YOUR TOES (washing for 3-5 minutes)  DO NOT use CHG soap on face, private areas, open wounds, or sores.  Pay special attention to the area where your surgery is being performed.  If you are having back surgery, having someone wash your back for you may be helpful. Wait 2 minutes after CHG soap is applied, then you may rinse off the CHG soap.  Pat dry with a clean towel  Put on clean clothes/pajamas   If you choose to wear lotion, please use ONLY the CHG-compatible lotions that are listed below.  Additional instructions for the day of surgery: DO NOT APPLY any lotions, deodorants, cologne, or perfumes.   Do not bring valuables to the hospital. [facility] is not responsible for any belongings/valuables. Do not wear nail polish, gel polish, artificial nails, or any other type of covering on natural nails (fingers and toes) Do not wear jewelry or makeup Put on clean/comfortable clothes.  Please brush your teeth.  Ask your nurse before applying any prescription medications to the skin.     CHG Compatible Lotions   Aveeno Moisturizing lotion  Cetaphil Moisturizing Cream  Cetaphil Moisturizing Lotion  Clairol Herbal Essence Moisturizing Lotion, Dry Skin  Clairol Herbal Essence Moisturizing Lotion, Extra Dry Skin  Clairol Herbal Essence Moisturizing Lotion, Normal Skin  Curel Age Defying Therapeutic Moisturizing Lotion with Alpha Hydroxy  Curel Extreme Care Body Lotion  Curel Soothing Hands Moisturizing Hand Lotion  Curel Therapeutic Moisturizing Cream, Fragrance-Free  Curel Therapeutic Moisturizing Lotion, Fragrance-Free  Curel Therapeutic Moisturizing Lotion, Original Formula  Eucerin Daily Replenishing Lotion  Eucerin Dry Skin Therapy Plus Alpha Hydroxy Crme  Eucerin Dry Skin Therapy Plus Alpha Hydroxy Lotion  Eucerin Original Crme  Eucerin Original Lotion  Eucerin Plus Crme Eucerin Plus Lotion  Eucerin  TriLipid Replenishing Lotion  Keri Anti-Bacterial Hand Lotion  Keri Deep Conditioning Original Lotion Dry Skin Formula Softly Scented  Keri Deep Conditioning Original Lotion, Fragrance Free Sensitive Skin Formula  Keri Lotion Fast Absorbing Fragrance Free Sensitive Skin Formula  Keri Lotion Fast Absorbing Softly Scented Dry Skin Formula  Keri Original Lotion  Keri Skin Renewal Lotion Keri Silky Smooth Lotion  Keri Silky Smooth Sensitive Skin Lotion  Nivea Body Creamy Conditioning Oil  Nivea Body Extra Enriched Lotion  Nivea Body Original Lotion  Nivea Body Sheer Moisturizing Lotion Nivea Crme  Nivea Skin Firming Lotion  NutraDerm 30 Skin Lotion  NutraDerm Skin Lotion  NutraDerm Therapeutic Skin Cream  NutraDerm Therapeutic Skin Lotion  ProShield Protective Hand Cream  Provon moisturizing lotion  Please read over the following fact sheets that you were given.

## 2023-10-09 ENCOUNTER — Encounter (HOSPITAL_COMMUNITY)
Admission: RE | Admit: 2023-10-09 | Discharge: 2023-10-09 | Disposition: A | Discharge status: Home / Self Care | Source: Ambulatory Visit | Attending: Neurosurgery | Admitting: Neurosurgery

## 2023-10-09 ENCOUNTER — Encounter (HOSPITAL_COMMUNITY): Payer: Self-pay

## 2023-10-09 ENCOUNTER — Other Ambulatory Visit: Payer: Self-pay

## 2023-10-09 VITALS — BP 133/78 | HR 97 | Temp 97.7°F | Resp 18 | Ht 65.0 in | Wt 219.4 lb

## 2023-10-09 DIAGNOSIS — Z79899 Other long term (current) drug therapy: Secondary | ICD-10-CM | POA: Diagnosis not present

## 2023-10-09 DIAGNOSIS — F17210 Nicotine dependence, cigarettes, uncomplicated: Secondary | ICD-10-CM | POA: Diagnosis present

## 2023-10-09 DIAGNOSIS — Z7951 Long term (current) use of inhaled steroids: Secondary | ICD-10-CM | POA: Diagnosis not present

## 2023-10-09 DIAGNOSIS — S129XXA Fracture of neck, unspecified, initial encounter: Secondary | ICD-10-CM | POA: Diagnosis not present

## 2023-10-09 DIAGNOSIS — Z01818 Encounter for other preprocedural examination: Secondary | ICD-10-CM | POA: Insufficient documentation

## 2023-10-09 DIAGNOSIS — F418 Other specified anxiety disorders: Secondary | ICD-10-CM | POA: Diagnosis not present

## 2023-10-09 DIAGNOSIS — K219 Gastro-esophageal reflux disease without esophagitis: Secondary | ICD-10-CM | POA: Diagnosis present

## 2023-10-09 DIAGNOSIS — M4712 Other spondylosis with myelopathy, cervical region: Secondary | ICD-10-CM | POA: Insufficient documentation

## 2023-10-09 DIAGNOSIS — Z4789 Encounter for other orthopedic aftercare: Secondary | ICD-10-CM | POA: Diagnosis not present

## 2023-10-09 DIAGNOSIS — E669 Obesity, unspecified: Secondary | ICD-10-CM | POA: Diagnosis present

## 2023-10-09 DIAGNOSIS — Z880 Allergy status to penicillin: Secondary | ICD-10-CM | POA: Diagnosis not present

## 2023-10-09 DIAGNOSIS — Z885 Allergy status to narcotic agent status: Secondary | ICD-10-CM | POA: Diagnosis not present

## 2023-10-09 DIAGNOSIS — M4722 Other spondylosis with radiculopathy, cervical region: Secondary | ICD-10-CM | POA: Insufficient documentation

## 2023-10-09 DIAGNOSIS — J449 Chronic obstructive pulmonary disease, unspecified: Secondary | ICD-10-CM | POA: Diagnosis present

## 2023-10-09 DIAGNOSIS — Z6836 Body mass index (BMI) 36.0-36.9, adult: Secondary | ICD-10-CM | POA: Diagnosis not present

## 2023-10-09 DIAGNOSIS — Y848 Other medical procedures as the cause of abnormal reaction of the patient, or of later complication, without mention of misadventure at the time of the procedure: Secondary | ICD-10-CM | POA: Diagnosis present

## 2023-10-09 DIAGNOSIS — M542 Cervicalgia: Secondary | ICD-10-CM | POA: Diagnosis present

## 2023-10-09 DIAGNOSIS — M960 Pseudarthrosis after fusion or arthrodesis: Secondary | ICD-10-CM | POA: Diagnosis present

## 2023-10-09 LAB — BASIC METABOLIC PANEL WITH GFR
Anion gap: 10 (ref 5–15)
BUN: <5 mg/dL — ABNORMAL LOW (ref 6–20)
CO2: 25 mmol/L (ref 22–32)
Calcium: 9.6 mg/dL (ref 8.9–10.3)
Chloride: 103 mmol/L (ref 98–111)
Creatinine, Ser: 0.9 mg/dL (ref 0.61–1.24)
GFR, Estimated: >60 mL/min (ref >60)
Glucose, Bld: 87 mg/dL (ref 70–99)
Potassium: 4.6 mmol/L (ref 3.5–5.1)
Sodium: 138 mmol/L (ref 135–145)

## 2023-10-09 LAB — TYPE AND SCREEN
ABO/RH(D): O POS
Antibody Screen: NEGATIVE

## 2023-10-09 LAB — CBC
HCT: 49 % (ref 39.0–52.0)
Hemoglobin: 16.7 g/dL (ref 13.0–17.0)
MCH: 30.9 pg (ref 26.0–34.0)
MCHC: 34.1 g/dL (ref 30.0–36.0)
MCV: 90.7 fL (ref 80.0–100.0)
Platelets: 384 K/uL (ref 150–400)
RBC: 5.4 MIL/uL (ref 4.22–5.81)
RDW: 13.2 % (ref 11.5–15.5)
WBC: 16.3 K/uL — ABNORMAL HIGH (ref 4.0–10.5)
nRBC: 0 % (ref 0.0–0.2)

## 2023-10-09 LAB — SURGICAL PCR SCREEN
MRSA, PCR: NEGATIVE
Staphylococcus aureus: NEGATIVE

## 2023-10-09 NOTE — Progress Notes (Addendum)
 PCP - Taylor Coffey COME Cardiologist - denies  PPM/ICD - denies Device Orders -  Rep Notified -   Chest x-ray - na EKG - 10/09/23 Stress Test - denies ECHO - denies Cardiac Cath - denies  Sleep Study - 2012 at [facility] CPAP - no  Fasting Blood Sugar - na Checks Blood Sugar _____ times a day  Last dose of GLP1 agonist-  na GLP1 instructions:   Blood Thinner Instructions:na Aspirin Instructions:na  ERAS Protcol -NPO PRE-SURGERY Ensure or G2-   COVID TEST- NA   Anesthesia review: yes- pt's WBC's 16.3  Patient denies shortness of breath, fever, cough and chest pain at PAT appointment   All instructions explained to the patient, with a verbal understanding of the material. Patient agrees to go over the instructions while at home for a better understanding. The opportunity to ask questions was provided.

## 2023-10-09 NOTE — Progress Notes (Signed)
 Surgical Instructions   Your procedure is scheduled on Monday October 12, 2023. Report to [facility] Main Entrance A at 8:30 A.M., then check in with the Admitting office. Any questions or running late day of surgery: call [phone]  Questions prior to your surgery date: call [phone], Monday-Friday, 8am-4pm. If you experience any cold or flu symptoms such as cough, fever, chills, shortness of breath, etc. between now and your scheduled surgery, please notify us  at the above number.     Remember:  Do not eat or drink after midnight the night before your surgery    Take these medicines the morning of surgery with A SIP OF WATER  Fluticasone-Umeclidin-Vilant (TRELEGY ELLIPTA)  gabapentin  (NEURONTIN )  oxyCODONE -acetaminophen  (PERCOCET/ROXICET)   May take these medicines IF NEEDED: albuterol  (VENTOLIN  HFA) -bring to the hospital  One week prior to surgery, STOP taking any Aspirin (unless otherwise instructed by your surgeon) Aleve, Naproxen, Ibuprofen, Motrin, Advil, Goody's, BC's, all herbal medications, fish oil, and non-prescription vitamins.                     Do NOT Smoke (Tobacco/Vaping) for 24 hours prior to your procedure.  If you use a CPAP at night, you may bring your mask/headgear for your overnight stay.   You will be asked to remove any contacts, glasses, piercing's, hearing aid's, dentures/partials prior to surgery. Please bring cases for these items if needed.    Patients discharged the day of surgery will not be allowed to drive home, and someone needs to stay with them for 24 hours.  SURGICAL WAITING ROOM VISITATION Patients may have no more than 2 support people in the waiting area - these visitors may rotate.   Pre-op nurse will coordinate an appropriate time for 1 ADULT support person, who may not rotate, to accompany patient in pre-op.  Children under the age of 64 must have an adult with them who is not the patient and must remain in the main waiting  area with an adult.  If the patient needs to stay at the hospital during part of their recovery, the visitor guidelines for inpatient rooms apply.  Please refer to the [facility] website for the visitor guidelines for any additional information.   If you received a COVID test during your pre-op visit  it is requested that you wear a mask when out in public, stay away from anyone that may not be feeling well and notify your surgeon if you develop symptoms. If you have been in contact with anyone that has tested positive in the last 10 days please notify you surgeon.      Pre-operative 5 CHG Bathing Instructions   You can play a key role in reducing the risk of infection after surgery. Your skin needs to be as free of germs as possible. You can reduce the number of germs on your skin by washing with CHG (chlorhexidine  gluconate) soap before surgery. CHG is an antiseptic soap that kills germs and continues to kill germs even after washing.   DO NOT use if you have an allergy to chlorhexidine /CHG or antibacterial soaps. If your skin becomes reddened or irritated, stop using the CHG and notify one of our RNs at [phone].   Please shower with the CHG soap starting 4 days before surgery using the following schedule:     Please keep in mind the following:  DO NOT shave, including legs and underarms, starting the day of your first shower.   You may  shave your face at any point before/day of surgery.  Place clean sheets on your bed the day you start using CHG soap. Use a clean washcloth (not used since being washed) for each shower. DO NOT sleep with pets once you start using the CHG.   CHG Shower Instructions:  Wash your face and private area with normal soap. If you choose to wash your hair, wash first with your normal shampoo.  After you use shampoo/soap, rinse your hair and body thoroughly to remove shampoo/soap residue.  Turn the water OFF and apply about 3 tablespoons (45 ml) of CHG  soap to a CLEAN washcloth.  Apply CHG soap ONLY FROM YOUR NECK DOWN TO YOUR TOES (washing for 3-5 minutes)  DO NOT use CHG soap on face, private areas, open wounds, or sores.  Pay special attention to the area where your surgery is being performed.  If you are having back surgery, having someone wash your back for you may be helpful. Wait 2 minutes after CHG soap is applied, then you may rinse off the CHG soap.  Pat dry with a clean towel  Put on clean clothes/pajamas   If you choose to wear lotion, please use ONLY the CHG-compatible lotions that are listed below.  Additional instructions for the day of surgery: DO NOT APPLY any lotions, deodorants, cologne, or perfumes.   Do not bring valuables to the hospital. [facility] is not responsible for any belongings/valuables. Do not wear nail polish, gel polish, artificial nails, or any other type of covering on natural nails (fingers and toes) Do not wear jewelry or makeup Put on clean/comfortable clothes.  Please brush your teeth.  Ask your nurse before applying any prescription medications to the skin.     CHG Compatible Lotions   Aveeno Moisturizing lotion  Cetaphil Moisturizing Cream  Cetaphil Moisturizing Lotion  Clairol Herbal Essence Moisturizing Lotion, Dry Skin  Clairol Herbal Essence Moisturizing Lotion, Extra Dry Skin  Clairol Herbal Essence Moisturizing Lotion, Normal Skin  Curel Age Defying Therapeutic Moisturizing Lotion with Alpha Hydroxy  Curel Extreme Care Body Lotion  Curel Soothing Hands Moisturizing Hand Lotion  Curel Therapeutic Moisturizing Cream, Fragrance-Free  Curel Therapeutic Moisturizing Lotion, Fragrance-Free  Curel Therapeutic Moisturizing Lotion, Original Formula  Eucerin Daily Replenishing Lotion  Eucerin Dry Skin Therapy Plus Alpha Hydroxy Crme  Eucerin Dry Skin Therapy Plus Alpha Hydroxy Lotion  Eucerin Original Crme  Eucerin Original Lotion  Eucerin Plus Crme Eucerin Plus Lotion  Eucerin  TriLipid Replenishing Lotion  Keri Anti-Bacterial Hand Lotion  Keri Deep Conditioning Original Lotion Dry Skin Formula Softly Scented  Keri Deep Conditioning Original Lotion, Fragrance Free Sensitive Skin Formula  Keri Lotion Fast Absorbing Fragrance Free Sensitive Skin Formula  Keri Lotion Fast Absorbing Softly Scented Dry Skin Formula  Keri Original Lotion  Keri Skin Renewal Lotion Keri Silky Smooth Lotion  Keri Silky Smooth Sensitive Skin Lotion  Nivea Body Creamy Conditioning Oil  Nivea Body Extra Enriched Lotion  Nivea Body Original Lotion  Nivea Body Sheer Moisturizing Lotion Nivea Crme  Nivea Skin Firming Lotion  NutraDerm 30 Skin Lotion  NutraDerm Skin Lotion  NutraDerm Therapeutic Skin Cream  NutraDerm Therapeutic Skin Lotion  ProShield Protective Hand Cream  Provon moisturizing lotion  Please read over the following fact sheets that you were given.

## 2023-10-09 NOTE — Progress Notes (Signed)
 Notified Dr. Mavis via telephone that patient's [facility] on his CBC today at PAT were 16.3. Pt was afebrile,and denied any recent cough,cold,sore throat or illness. Per Dr. Mavis, pt will be evaluated day of surgery.

## 2023-10-12 ENCOUNTER — Encounter (HOSPITAL_COMMUNITY)
Admission: RE | Disposition: A | Discharge status: Home / Self Care | Payer: Self-pay | Source: Home / Self Care | Attending: Neurosurgery

## 2023-10-12 ENCOUNTER — Inpatient Hospital Stay (HOSPITAL_COMMUNITY): Admitting: Certified Registered Nurse Anesthetist

## 2023-10-12 ENCOUNTER — Other Ambulatory Visit: Payer: Self-pay

## 2023-10-12 ENCOUNTER — Encounter (HOSPITAL_COMMUNITY): Payer: Self-pay | Admitting: Neurosurgery

## 2023-10-12 ENCOUNTER — Inpatient Hospital Stay (HOSPITAL_COMMUNITY)
Admission: RE | Admit: 2023-10-12 | Discharge: 2023-10-13 | DRG: 473 | Disposition: A | Discharge status: Home / Self Care | Attending: Neurosurgery | Admitting: Neurosurgery

## 2023-10-12 ENCOUNTER — Inpatient Hospital Stay (HOSPITAL_COMMUNITY)

## 2023-10-12 DIAGNOSIS — Z79899 Other long term (current) drug therapy: Secondary | ICD-10-CM

## 2023-10-12 DIAGNOSIS — Z6836 Body mass index (BMI) 36.0-36.9, adult: Secondary | ICD-10-CM | POA: Diagnosis not present

## 2023-10-12 DIAGNOSIS — E669 Obesity, unspecified: Secondary | ICD-10-CM | POA: Diagnosis present

## 2023-10-12 DIAGNOSIS — J449 Chronic obstructive pulmonary disease, unspecified: Secondary | ICD-10-CM | POA: Diagnosis present

## 2023-10-12 DIAGNOSIS — Y848 Other medical procedures as the cause of abnormal reaction of the patient, or of later complication, without mention of misadventure at the time of the procedure: Secondary | ICD-10-CM | POA: Diagnosis present

## 2023-10-12 DIAGNOSIS — F418 Other specified anxiety disorders: Secondary | ICD-10-CM

## 2023-10-12 DIAGNOSIS — S129XXA Fracture of neck, unspecified, initial encounter: Principal | ICD-10-CM | POA: Diagnosis present

## 2023-10-12 DIAGNOSIS — M960 Pseudarthrosis after fusion or arthrodesis: Secondary | ICD-10-CM | POA: Diagnosis present

## 2023-10-12 DIAGNOSIS — Z885 Allergy status to narcotic agent status: Secondary | ICD-10-CM | POA: Diagnosis not present

## 2023-10-12 DIAGNOSIS — Z880 Allergy status to penicillin: Secondary | ICD-10-CM

## 2023-10-12 DIAGNOSIS — F17210 Nicotine dependence, cigarettes, uncomplicated: Secondary | ICD-10-CM | POA: Diagnosis present

## 2023-10-12 DIAGNOSIS — Z7951 Long term (current) use of inhaled steroids: Secondary | ICD-10-CM

## 2023-10-12 DIAGNOSIS — M542 Cervicalgia: Secondary | ICD-10-CM | POA: Diagnosis present

## 2023-10-12 DIAGNOSIS — K219 Gastro-esophageal reflux disease without esophagitis: Secondary | ICD-10-CM | POA: Diagnosis present

## 2023-10-12 DIAGNOSIS — Z4789 Encounter for other orthopedic aftercare: Secondary | ICD-10-CM | POA: Diagnosis not present

## 2023-10-12 HISTORY — PX: POSTERIOR CERVICAL FUSION/FORAMINOTOMY: SHX5038

## 2023-10-12 SURGERY — POSTERIOR CERVICAL FUSION/FORAMINOTOMY LEVEL 4
Anesthesia: General | Site: Spine Cervical

## 2023-10-12 MED ORDER — PANTOPRAZOLE SODIUM 40 MG PO TBEC
40.0000 mg | DELAYED_RELEASE_TABLET | Freq: Every day | ORAL | Status: DC
  Administered 2023-10-12: 40 mg via ORAL
  Filled 2023-10-12: qty 1

## 2023-10-12 MED ORDER — OXYCODONE-ACETAMINOPHEN 7.5-325 MG PO TABS
1.0000 | ORAL_TABLET | ORAL | Status: DC | PRN
  Administered 2023-10-12 – 2023-10-13 (×3): 2 via ORAL
  Filled 2023-10-12 (×3): qty 2

## 2023-10-12 MED ORDER — CHLORHEXIDINE GLUCONATE CLOTH 2 % EX PADS: 6.0000 | MEDICATED_PAD | Freq: Once | CUTANEOUS | Status: DC

## 2023-10-12 MED ORDER — ONDANSETRON HCL 4 MG PO TABS: 4.0000 mg | ORAL_TABLET | Freq: Four times a day (QID) | ORAL | Status: DC | PRN

## 2023-10-12 MED ORDER — GABAPENTIN 400 MG PO CAPS
800.0000 mg | ORAL_CAPSULE | Freq: Three times a day (TID) | ORAL | Status: DC
  Administered 2023-10-12: 800 mg via ORAL
  Filled 2023-10-12: qty 2

## 2023-10-12 MED ORDER — CELECOXIB 200 MG PO CAPS
200.0000 mg | ORAL_CAPSULE | Freq: Once | ORAL | Status: AC
Start: 2023-10-12 — End: 2023-10-12
  Administered 2023-10-12: 200 mg via ORAL
  Filled 2023-10-12: qty 1

## 2023-10-12 MED ORDER — BACITRACIN ZINC 500 UNIT/GM EX OINT
TOPICAL_OINTMENT | CUTANEOUS | Status: DC | PRN
  Administered 2023-10-12: 1 via TOPICAL

## 2023-10-12 MED ORDER — ONDANSETRON HCL 4 MG/2ML IJ SOLN
INTRAMUSCULAR | Status: AC
  Filled 2023-10-12: qty 2

## 2023-10-12 MED ORDER — ORAL CARE MOUTH RINSE: 15.0000 mL | Freq: Once | OROMUCOSAL | Status: AC

## 2023-10-12 MED ORDER — 0.9 % SODIUM CHLORIDE (POUR BTL) OPTIME
TOPICAL | Status: DC | PRN
  Administered 2023-10-12: 1000 mL

## 2023-10-12 MED ORDER — ACETAMINOPHEN 325 MG PO TABS: 650.0000 mg | ORAL_TABLET | ORAL | Status: DC | PRN

## 2023-10-12 MED ORDER — ONDANSETRON HCL 4 MG/2ML IJ SOLN: 4.0000 mg | Freq: Four times a day (QID) | INTRAMUSCULAR | Status: DC | PRN

## 2023-10-12 MED ORDER — BACITRACIN ZINC 500 UNIT/GM EX OINT
TOPICAL_OINTMENT | CUTANEOUS | Status: AC
  Filled 2023-10-12: qty 28.35

## 2023-10-12 MED ORDER — BUPIVACAINE-EPINEPHRINE (PF) 0.5% -1:200000 IJ SOLN
INTRAMUSCULAR | Status: DC | PRN
  Administered 2023-10-12: 10 mL

## 2023-10-12 MED ORDER — DEXAMETHASONE SODIUM PHOSPHATE 10 MG/ML IJ SOLN
INTRAMUSCULAR | Status: AC
  Filled 2023-10-12: qty 1

## 2023-10-12 MED ORDER — CEFAZOLIN SODIUM-DEXTROSE 2-4 GM/100ML-% IV SOLN
2.0000 g | Freq: Three times a day (TID) | INTRAVENOUS | Status: AC
  Administered 2023-10-12 – 2023-10-13 (×2): 2 g via INTRAVENOUS
  Filled 2023-10-12 (×2): qty 100

## 2023-10-12 MED ORDER — LACTATED RINGERS IV SOLN: INTRAVENOUS | Status: DC

## 2023-10-12 MED ORDER — LIDOCAINE 2% (20 MG/ML) 5 ML SYRINGE
INTRAMUSCULAR | Status: DC | PRN
Start: 2023-10-12 — End: 2023-10-12
  Administered 2023-10-12: 100 mg via INTRAVENOUS

## 2023-10-12 MED ORDER — SENNA 8.6 MG PO TABS
1.0000 | ORAL_TABLET | Freq: Two times a day (BID) | ORAL | Status: DC | PRN
Start: 2023-10-12 — End: 2023-10-13
  Filled 2023-10-12: qty 1

## 2023-10-12 MED ORDER — BUPIVACAINE LIPOSOME 1.3 % IJ SUSP
INTRAMUSCULAR | Status: AC
  Filled 2023-10-12: qty 20

## 2023-10-12 MED ORDER — MIDAZOLAM HCL 2 MG/2ML IJ SOLN
INTRAMUSCULAR | Status: AC
Start: 2023-10-12 — End: 2023-10-12
  Filled 2023-10-12: qty 2

## 2023-10-12 MED ORDER — DEXAMETHASONE SODIUM PHOSPHATE 10 MG/ML IJ SOLN
INTRAMUSCULAR | Status: DC | PRN
  Administered 2023-10-12: 10 mg via INTRAVENOUS

## 2023-10-12 MED ORDER — MIDAZOLAM HCL 2 MG/2ML IJ SOLN
INTRAMUSCULAR | Status: DC | PRN
  Administered 2023-10-12: 2 mg via INTRAVENOUS

## 2023-10-12 MED ORDER — MORPHINE SULFATE (PF) 2 MG/ML IV SOLN
2.0000 mg | INTRAVENOUS | Status: DC | PRN
  Administered 2023-10-12: 4 mg via INTRAVENOUS
  Administered 2023-10-12: 2 mg via INTRAVENOUS
  Filled 2023-10-12: qty 2
  Filled 2023-10-12: qty 1

## 2023-10-12 MED ORDER — CHLORHEXIDINE GLUCONATE 0.12 % MT SOLN
15.0000 mL | Freq: Once | OROMUCOSAL | Status: AC
  Administered 2023-10-12: 15 mL via OROMUCOSAL
  Filled 2023-10-12: qty 15

## 2023-10-12 MED ORDER — PANTOPRAZOLE SODIUM 40 MG PO TBEC: 40.0000 mg | DELAYED_RELEASE_TABLET | Freq: Every day | ORAL | Status: DC | PRN

## 2023-10-12 MED ORDER — SUGAMMADEX SODIUM 200 MG/2ML IV SOLN
INTRAVENOUS | Status: DC | PRN
  Administered 2023-10-12: 200 mg via INTRAVENOUS

## 2023-10-12 MED ORDER — ALUM & MAG HYDROXIDE-SIMETH 200-200-20 MG/5ML PO SUSP: 30.0000 mL | Freq: Four times a day (QID) | ORAL | Status: DC | PRN

## 2023-10-12 MED ORDER — ACETAMINOPHEN 500 MG PO TABS
1000.0000 mg | ORAL_TABLET | Freq: Once | ORAL | Status: AC
  Administered 2023-10-12: 1000 mg via ORAL
  Filled 2023-10-12: qty 2

## 2023-10-12 MED ORDER — HYDROMORPHONE HCL 1 MG/ML IJ SOLN
0.2500 mg | INTRAMUSCULAR | Status: DC | PRN
  Administered 2023-10-12 (×2): 0.5 mg via INTRAVENOUS

## 2023-10-12 MED ORDER — LIDOCAINE 2% (20 MG/ML) 5 ML SYRINGE
INTRAMUSCULAR | Status: AC
  Filled 2023-10-12: qty 5

## 2023-10-12 MED ORDER — ROCURONIUM BROMIDE 10 MG/ML (PF) SYRINGE
PREFILLED_SYRINGE | INTRAVENOUS | Status: DC | PRN
  Administered 2023-10-12 (×2): 20 mg via INTRAVENOUS
  Administered 2023-10-12: 60 mg via INTRAVENOUS
  Administered 2023-10-12: 10 mg via INTRAVENOUS

## 2023-10-12 MED ORDER — ACETAMINOPHEN 650 MG RE SUPP: 650.0000 mg | RECTAL | Status: DC | PRN

## 2023-10-12 MED ORDER — ALBUTEROL SULFATE HFA 108 (90 BASE) MCG/ACT IN AERS
INHALATION_SPRAY | RESPIRATORY_TRACT | Status: DC | PRN
  Administered 2023-10-12: 4 via RESPIRATORY_TRACT

## 2023-10-12 MED ORDER — CYCLOBENZAPRINE HCL 10 MG PO TABS
10.0000 mg | ORAL_TABLET | Freq: Three times a day (TID) | ORAL | Status: DC | PRN
  Administered 2023-10-12 – 2023-10-13 (×2): 10 mg via ORAL
  Filled 2023-10-12 (×2): qty 1

## 2023-10-12 MED ORDER — MIDAZOLAM HCL 2 MG/2ML IJ SOLN
INTRAMUSCULAR | Status: AC
  Filled 2023-10-12: qty 2

## 2023-10-12 MED ORDER — PROPOFOL 10 MG/ML IV BOLUS
INTRAVENOUS | Status: AC
Start: 2023-10-12 — End: 2023-10-12
  Filled 2023-10-12: qty 20

## 2023-10-12 MED ORDER — DROPERIDOL 2.5 MG/ML IJ SOLN: 0.6250 mg | Freq: Once | INTRAMUSCULAR | Status: DC | PRN

## 2023-10-12 MED ORDER — FENTANYL CITRATE (PF) 250 MCG/5ML IJ SOLN
INTRAMUSCULAR | Status: DC | PRN
  Administered 2023-10-12 (×2): 50 ug via INTRAVENOUS
  Administered 2023-10-12: 100 ug via INTRAVENOUS
  Administered 2023-10-12: 50 ug via INTRAVENOUS

## 2023-10-12 MED ORDER — PHENOL 1.4 % MT LIQD: 1.0000 | OROMUCOSAL | Status: DC | PRN

## 2023-10-12 MED ORDER — BUPIVACAINE-EPINEPHRINE (PF) 0.5% -1:200000 IJ SOLN
INTRAMUSCULAR | Status: AC
  Filled 2023-10-12: qty 30

## 2023-10-12 MED ORDER — ALBUTEROL SULFATE (2.5 MG/3ML) 0.083% IN NEBU: 3.0000 mL | INHALATION_SOLUTION | Freq: Four times a day (QID) | RESPIRATORY_TRACT | Status: DC | PRN

## 2023-10-12 MED ORDER — ROCURONIUM BROMIDE 10 MG/ML (PF) SYRINGE
PREFILLED_SYRINGE | INTRAVENOUS | Status: AC
  Filled 2023-10-12: qty 20

## 2023-10-12 MED ORDER — MENTHOL 3 MG MT LOZG: 1.0000 | LOZENGE | OROMUCOSAL | Status: DC | PRN

## 2023-10-12 MED ORDER — VANCOMYCIN HCL IN DEXTROSE 1-5 GM/200ML-% IV SOLN
1000.0000 mg | INTRAVENOUS | Status: AC
  Administered 2023-10-12: 1000 mg via INTRAVENOUS
  Filled 2023-10-12: qty 200

## 2023-10-12 MED ORDER — PROPOFOL 10 MG/ML IV BOLUS
INTRAVENOUS | Status: AC
  Filled 2023-10-12: qty 20

## 2023-10-12 MED ORDER — PROPOFOL 10 MG/ML IV BOLUS
INTRAVENOUS | Status: DC | PRN
  Administered 2023-10-12: 50 mg via INTRAVENOUS
  Administered 2023-10-12: 200 mg via INTRAVENOUS

## 2023-10-12 MED ORDER — BUPIVACAINE LIPOSOME 1.3 % IJ SUSP
INTRAMUSCULAR | Status: DC | PRN
  Administered 2023-10-12: 20 mL

## 2023-10-12 MED ORDER — HYDROMORPHONE HCL 1 MG/ML IJ SOLN
INTRAMUSCULAR | Status: AC
  Filled 2023-10-12: qty 1

## 2023-10-12 MED ORDER — PANTOPRAZOLE SODIUM 40 MG IV SOLR: 40.0000 mg | Freq: Every day | INTRAVENOUS | Status: DC

## 2023-10-12 MED ORDER — DEXMEDETOMIDINE HCL IN NACL 80 MCG/20ML IV SOLN
INTRAVENOUS | Status: DC | PRN
  Administered 2023-10-12 (×4): 8 ug via INTRAVENOUS

## 2023-10-12 MED ORDER — THROMBIN 5000 UNITS EX KIT
PACK | CUTANEOUS | Status: AC
  Filled 2023-10-12: qty 1

## 2023-10-12 MED ORDER — HYDROMORPHONE HCL 1 MG/ML IJ SOLN
0.2500 mg | INTRAMUSCULAR | Status: DC | PRN
  Administered 2023-10-12 (×4): 0.5 mg via INTRAVENOUS

## 2023-10-12 MED ORDER — DEXAMETHASONE 4 MG PO TABS
4.0000 mg | ORAL_TABLET | Freq: Four times a day (QID) | ORAL | Status: AC
  Administered 2023-10-12: 4 mg via ORAL
  Filled 2023-10-12: qty 1

## 2023-10-12 MED ORDER — PHENYLEPHRINE HCL-NACL 20-0.9 MG/250ML-% IV SOLN
INTRAVENOUS | Status: DC | PRN
  Administered 2023-10-12: 20 ug/min via INTRAVENOUS

## 2023-10-12 MED ORDER — BISACODYL 10 MG RE SUPP: 10.0000 mg | Freq: Every day | RECTAL | Status: DC | PRN

## 2023-10-12 MED ORDER — DOCUSATE SODIUM 100 MG PO CAPS
100.0000 mg | ORAL_CAPSULE | Freq: Two times a day (BID) | ORAL | Status: DC
Start: 2023-10-12 — End: 2023-10-13
  Filled 2023-10-12: qty 1

## 2023-10-12 MED ORDER — THROMBIN 5000 UNITS EX SOLR
OROMUCOSAL | Status: DC | PRN
  Administered 2023-10-12: 5 mL via TOPICAL

## 2023-10-12 MED ORDER — BUDESON-GLYCOPYRROL-FORMOTEROL 160-9-4.8 MCG/ACT IN AERO
2.0000 | INHALATION_SPRAY | Freq: Two times a day (BID) | RESPIRATORY_TRACT | Status: DC
  Administered 2023-10-12 – 2023-10-13 (×2): 2 via RESPIRATORY_TRACT
  Filled 2023-10-12: qty 5.9

## 2023-10-12 MED ORDER — FENTANYL CITRATE (PF) 250 MCG/5ML IJ SOLN
INTRAMUSCULAR | Status: AC
  Filled 2023-10-12: qty 5

## 2023-10-12 MED ORDER — HYDROMORPHONE HCL 1 MG/ML IJ SOLN
INTRAMUSCULAR | Status: DC | PRN
  Administered 2023-10-12: .5 mg via INTRAVENOUS

## 2023-10-12 MED ORDER — DEXAMETHASONE SODIUM PHOSPHATE 4 MG/ML IJ SOLN
4.0000 mg | Freq: Four times a day (QID) | INTRAMUSCULAR | Status: AC
  Administered 2023-10-12: 4 mg via INTRAVENOUS
  Filled 2023-10-12: qty 1

## 2023-10-12 MED ORDER — HYDROMORPHONE HCL 1 MG/ML IJ SOLN
INTRAMUSCULAR | Status: AC
  Filled 2023-10-12: qty 0.5

## 2023-10-12 MED ORDER — ONDANSETRON HCL 4 MG/2ML IJ SOLN
INTRAMUSCULAR | Status: DC | PRN
  Administered 2023-10-12: 4 mg via INTRAVENOUS

## 2023-10-12 SURGICAL SUPPLY — 55 items
BAG COUNTER SPONGE SURGICOUNT (BAG) ×1 IMPLANT
BAND RUBBER #18 3X1/16 STRL (MISCELLANEOUS) ×2 IMPLANT
BENZOIN TINCTURE PRP APPL 2/3 (GAUZE/BANDAGES/DRESSINGS) ×1 IMPLANT
BIT DRILL NEURO 2X3.1 SFT TUCH (MISCELLANEOUS) ×1 IMPLANT
BIT DRILL OCT ADJ 2.3 (BIT) IMPLANT
BLADE CLIPPER SURG (BLADE) ×1 IMPLANT
BLADE SURG 11 STRL SS (BLADE) IMPLANT
BLADE ULTRA TIP 2M (BLADE) IMPLANT
BUR PRECISION FLUTE 6.0 (BURR) IMPLANT
CANISTER SUCTION 3000ML PPV (SUCTIONS) ×1 IMPLANT
CAP CLSR POST CERV (Cap) IMPLANT
DRAPE C-ARM 42X72 X-RAY (DRAPES) ×2 IMPLANT
DRAPE LAPAROTOMY 100X72 PEDS (DRAPES) ×1 IMPLANT
DRAPE MICROSCOPE SLANT 54X150 (MISCELLANEOUS) IMPLANT
DRAPE SURG 17X23 STRL (DRAPES) ×3 IMPLANT
DRSG IV TEGADERM 3.5X4.5 STRL (GAUZE/BANDAGES/DRESSINGS) IMPLANT
DRSG OPSITE POSTOP 4X6 (GAUZE/BANDAGES/DRESSINGS) IMPLANT
ELECTRODE BLDE 4.0 EZ CLN MEGD (MISCELLANEOUS) ×1 IMPLANT
ELECTRODE REM PT RTRN 9FT ADLT (ELECTROSURGICAL) ×1 IMPLANT
EVACUATOR 1/8 PVC DRAIN (DRAIN) IMPLANT
GAUZE 4X4 16PLY ~~LOC~~+RFID DBL (SPONGE) IMPLANT
GAUZE SPONGE 4X4 12PLY STRL (GAUZE/BANDAGES/DRESSINGS) IMPLANT
GLOVE BIO SURGEON STRL SZ8 (GLOVE) ×1 IMPLANT
GLOVE BIO SURGEON STRL SZ8.5 (GLOVE) ×1 IMPLANT
GLOVE EXAM NITRILE XL STR (GLOVE) IMPLANT
GOWN STRL REUS W/ TWL LRG LVL3 (GOWN DISPOSABLE) IMPLANT
GOWN STRL REUS W/ TWL XL LVL3 (GOWN DISPOSABLE) ×1 IMPLANT
GOWN STRL REUS W/TWL 2XL LVL3 (GOWN DISPOSABLE) IMPLANT
HEMOSTAT POWDER KIT SURGIFOAM (HEMOSTASIS) ×1 IMPLANT
KIT BASIN OR (CUSTOM PROCEDURE TRAY) ×1 IMPLANT
KIT TURNOVER KIT B (KITS) ×1 IMPLANT
NDL HYPO 22X1.5 SAFETY MO (MISCELLANEOUS) ×1 IMPLANT
NDL SPNL 18GX3.5 QUINCKE PK (NEEDLE) IMPLANT
NEEDLE HYPO 22X1.5 SAFETY MO (MISCELLANEOUS) ×1 IMPLANT
NEEDLE SPNL 18GX3.5 QUINCKE PK (NEEDLE) IMPLANT
NS IRRIG 1000ML POUR BTL (IV SOLUTION) ×1 IMPLANT
PACK LAMINECTOMY NEURO (CUSTOM PROCEDURE TRAY) ×1 IMPLANT
PAD ARMBOARD POSITIONER FOAM (MISCELLANEOUS) ×3 IMPLANT
PIN MAYFIELD SKULL DISP (PIN) ×1 IMPLANT
PUTTY DBM 10CC CALC GRAN (Putty) IMPLANT
ROD CVD VIRAGE 3.5X80 (Rod) IMPLANT
SCREW VIRAGE 3.5X14 (Screw) IMPLANT
SPIKE FLUID TRANSFER (MISCELLANEOUS) ×1 IMPLANT
SPONGE NEURO XRAY DETECT 1X3 (DISPOSABLE) IMPLANT
SPONGE SURGIFOAM ABS GEL 100 (HEMOSTASIS) IMPLANT
SPONGE T-LAP 4X18 ~~LOC~~+RFID (SPONGE) IMPLANT
STAPLER SKIN PROX 35W (STAPLE) IMPLANT
STRIP CLOSURE SKIN 1/2X4 (GAUZE/BANDAGES/DRESSINGS) ×1 IMPLANT
SUT ETHILON 2 0 FS 18 (SUTURE) IMPLANT
SUT VIC AB 0 CT1 18XCR BRD8 (SUTURE) ×1 IMPLANT
SUT VIC AB 2-0 CP2 18 (SUTURE) ×1 IMPLANT
TOWEL GREEN STERILE (TOWEL DISPOSABLE) ×1 IMPLANT
TOWEL GREEN STERILE FF (TOWEL DISPOSABLE) ×1 IMPLANT
TRAY FOLEY MTR SLVR 16FR STAT (SET/KITS/TRAYS/PACK) IMPLANT
WATER STERILE IRR 1000ML POUR (IV SOLUTION) ×1 IMPLANT

## 2023-10-12 NOTE — Progress Notes (Signed)
 Orthopedic Tech Progress Note Patient Details:  Taylor Coffey 1980-12-24 [id number]  PACU RN called requesting an ASPEN COLLAR while patient was in recovery   Patient ID: Taylor Coffey, male   DOB: 1980-04-27, 43 y.o.   MRN: [id number]  Taylor Coffey Pac 10/12/2023, 5:32 PM

## 2023-10-12 NOTE — Anesthesia Preprocedure Evaluation (Addendum)
 Anesthesia Evaluation  Patient identified by MRN, date of birth, ID band Patient awake    Reviewed: Allergy & Precautions, NPO status , Patient's Chart, lab work & pertinent test results  History of Anesthesia Complications (+) PONV and history of anesthetic complications  Airway Mallampati: III  TM Distance: >3 FB Neck ROM: Limited    Dental  (+) Dental Advisory Given, Edentulous Upper, Edentulous Lower   Pulmonary neg shortness of breath, sleep apnea (does not use CPAP) , COPD,  COPD inhaler, neg recent URI, Current Smoker and Patient abstained from smoking.   Pulmonary exam normal breath sounds clear to auscultation       Cardiovascular (-) hypertension(-) angina (-) Past MI, (-) Cardiac Stents and (-) CABG negative cardio ROS (-) dysrhythmias  Rhythm:Regular Rate:Normal     Neuro/Psych neg Seizures PSYCHIATRIC DISORDERS Anxiety Depression     Neuromuscular disease (cervical spondylosis with radiculopathy, numbness/tingling/weakness BUE)    GI/Hepatic Neg liver ROS,GERD  Medicated,,  Endo/Other  negative endocrine ROS    Renal/GU negative Renal ROS  negative genitourinary   Musculoskeletal  (+) Arthritis , Osteoarthritis,    Abdominal  (+) + obese  Peds  Hematology negative hematology ROS (+) Lab Results      Component                Value               Date                      WBC                      16.3 (H)            10/09/2023                HGB                      16.7                10/09/2023                HCT                      49.0                10/09/2023                MCV                      90.7                10/09/2023                PLT                      384                 10/09/2023             Lab Results      Component                Value               Date                      NA  138                 10/09/2023                K                        4.6                  10/09/2023                CO2                      25                  10/09/2023                GLUCOSE                  87                  10/09/2023                BUN                      <5 (L)              10/09/2023                CREATININE               0.90                10/09/2023                CALCIUM                  9.6                 10/09/2023                GFRNONAA                 >60                 10/09/2023              Anesthesia Other Findings   Reproductive/Obstetrics                              Anesthesia Physical Anesthesia Plan  ASA: 3  Anesthesia Plan: General   Post-op Pain Management: Tylenol  PO (pre-op)*, Celebrex  PO (pre-op)* and Ketamine IV*   Induction: Intravenous  PONV Risk Score and Plan: 2 and Ondansetron , Propofol  infusion, Midazolam  and Treatment may vary due to age or medical condition  Airway Management Planned: Mask, Oral ETT and Video Laryngoscope Planned  Additional Equipment: Arterial line  Intra-op Plan:   Post-operative Plan: Extubation in OR  Informed Consent: I have reviewed the patients History and Physical, chart, labs and discussed the procedure including the risks, benefits and alternatives for the proposed anesthesia with the patient or authorized representative who has indicated his/her understanding and acceptance.     Dental advisory given  Plan Discussed with: CRNA and Anesthesiologist  Anesthesia Plan Comments: (Risks of general anesthesia discussed including, but not limited to, sore throat, hoarse voice, chipped/damaged teeth, injury to vocal cords, nausea and vomiting, allergic reactions, lung infection, heart attack, stroke, and death.  All questions answered. )         Anesthesia Quick Evaluation

## 2023-10-12 NOTE — Anesthesia Procedure Notes (Signed)
 Arterial Line Insertion Start/End9/22/2025 9:50 AM, 10/12/2023 10:00 AM Performed by: Harrold Macintosh, CRNA, CRNA  Patient location: Pre-op. Preanesthetic checklist: patient identified, IV checked, site marked, risks and benefits discussed, surgical consent, monitors and equipment checked, pre-op evaluation, timeout performed and anesthesia consent Lidocaine  1% used for infiltration Left, radial was placed Hand hygiene performed , maximum sterile barriers used  and Seldinger technique used Allen's test indicative of satisfactory collateral circulation Attempts: 1 Procedure performed without using ultrasound guided technique. Following insertion, Biopatch and dressing applied. Post procedure assessment: normal and unchanged  Patient tolerated the procedure well with no immediate complications.

## 2023-10-12 NOTE — Transfer of Care (Signed)
 Immediate Anesthesia Transfer of Care Note  Patient: Taylor Coffey  Procedure(s) Performed: POSTERIOR CERVICAL FUSION/FORAMINOTOMY LEVEL CERVICAL THREE-CERVICAL SEVEN (Spine Cervical)  Patient Location: PACU  Anesthesia Type:General  Level of Consciousness: drowsy and patient cooperative  Airway & Oxygen Therapy: Patient Spontanous Breathing and Patient connected to face mask oxygen  Post-op Assessment: Report given to RN, Post -op Vital signs reviewed and stable, and Patient moving all extremities X 4  Post vital signs: Reviewed and stable  Last Vitals:  Vitals Value Taken Time  BP 125/79 10/12/23 14:23  Temp    Pulse 82 10/12/23 14:29  Resp 17 10/12/23 14:29  SpO2 97 % 10/12/23 14:29  Vitals shown include unfiled device data.  Last Pain:  Vitals:   10/12/23 0918  TempSrc:   PainSc: 9       Patients Stated Pain Goal: 1 (10/12/23 0903)  Complications: No notable events documented.

## 2023-10-12 NOTE — H&P (Signed)
 Subjective: The patient is a 43 year old white male whose had previous anterior cervical discectomies, fusions and plating's.  He has complained of persistent neck pain.  He was worked up with a cervical CT which demonstrated findings consistent with a cervical pseudoarthrosis.  I discussed the various treatment options with him.  He has decided proceed with surgery.  Past Medical History:  Diagnosis Date   Anxiety    with crowds of people   Arthritis    Complication of anesthesia 2011ish   Woke up with headache, and nausea   COPD (chronic obstructive pulmonary disease) (HCC)    Depression    Head injury with loss of consciousness (HCC)    History of kidney stones    PONV (postoperative nausea and vomiting)    nausea   Sleep apnea     Past Surgical History:  Procedure Laterality Date   ANTERIOR CERVICAL DECOMP/DISCECTOMY FUSION N/A 02/14/2015   Procedure: Cervical five-six cervical six-seven  Anterior cervical decompression/diskectomy/fusion;  Surgeon: Morene Hicks Ditty, MD;  Location: [facility];  Service: Neurosurgery;  Laterality: N/A;   ANTERIOR CERVICAL DECOMP/DISCECTOMY FUSION N/A 03/07/2019   Procedure: ANTERIOR CERVICAL DECOMPRESSION/DISCECTOMY FUSION, INTERBODY PROSTHESIS, PLATE/SCREWS CERVICLA THREE- CERVICAL FOUR, CERVICAL FOUR- CERVICAL FIVE; EXPLORE FUSION WITH POSSIBLE HARDWARE REMOVAL;  Surgeon: Mavis Purchase, MD;  Location: [facility];  Service: Neurosurgery;  Laterality: N/A;  anterior   CARPAL TUNNEL RELEASE Left    MULTIPLE TOOTH EXTRACTIONS      Allergies  Allergen Reactions   Vicodin [Hydrocodone-Acetaminophen ] Itching    Opioid-induced pruritus   Amoxicillin Itching and Rash    Social History   Tobacco Use   Smoking status: Every Day    Current packs/day: 1.50    Average packs/day: 1.5 packs/day for 20.0 years (30.0 ttl pk-yrs)    Types: Cigarettes   Smokeless tobacco: Never  Substance Use Topics   Alcohol  use: No    History reviewed. No pertinent  family history. Prior to Admission medications   Medication Sig Start Date End Date Taking? Authorizing Provider  albuterol  (VENTOLIN  HFA) 108 (90 Base) MCG/ACT inhaler Inhale 2 puffs into the lungs every 6 (six) hours as needed for wheezing or shortness of breath.   Yes [provider]  cyclobenzaprine  (FLEXERIL ) 10 MG tablet Take 1 tablet (10 mg total) by mouth 3 (three) times daily as needed for muscle spasms. Patient taking differently: Take 20 mg by mouth at bedtime as needed for muscle spasms. 03/07/19  Yes Mavis Purchase, MD  Fluticasone-Umeclidin-Vilant (TRELEGY ELLIPTA) 200-62.5-25 MCG/ACT AEPB Inhale 1 puff into the lungs daily.   Yes [provider]  gabapentin  (NEURONTIN ) 400 MG capsule Take 800 mg by mouth 3 (three) times daily.  12/22/14  Yes [provider]  oxyCODONE -acetaminophen  (PERCOCET) 7.5-325 MG tablet Take 1 tablet by mouth every 8 (eight) hours as needed (pain).   Yes [provider]  pantoprazole  (PROTONIX ) 40 MG tablet Take 40 mg by mouth daily as needed (heartburn, indigestion).   Yes [provider]     Review of Systems  Positive ROS: As above  All other systems have been reviewed and were otherwise negative with the exception of those mentioned in the HPI and as above.  Objective: Vital signs in last 24 hours: Temp:  [98.2 F (36.8 C)] 98.2 F (36.8 C) (09/22 0831) Pulse Rate:  [82] 82 (09/22 0831) Resp:  [18] 18 (09/22 0831) BP: (149)/(94) 149/94 (09/22 0831) SpO2:  [97 %] 97 % (09/22 0831) Weight:  [99.8 kg] 99.8  kg (09/22 0831) Estimated body mass index is 36.61 kg/m as calculated from the following:   Height as of this encounter: 5' 5 (1.651 m).   Weight as of this encounter: 99.8 kg.   General Appearance: Alert Head: Normocephalic, without obvious abnormality, atraumatic Eyes: PERRL, conjunctiva/corneas clear, EOM's intact,    Ears: Normal  Throat: Normal  Neck: Limited cervical range of motion.   His anterior cervical incision is well-healed. Back: unremarkable Lungs: Clear to auscultation bilaterally, respirations unlabored Heart: Regular rate and rhythm, no murmur, rub or gallop Abdomen: Soft, non-tender Extremities: Extremities normal, atraumatic, no cyanosis or edema Skin: unremarkable  NEUROLOGIC:   Mental status: alert and oriented,Motor Exam - grossly normal Sensory Exam - grossly normal Reflexes:  Coordination - grossly normal Gait - grossly normal Balance - grossly normal Cranial Nerves: I: smell Not tested  II: visual acuity  OS: Normal  OD: Normal   II: visual fields Full to confrontation  II: pupils Equal, round, reactive to light  III,VII: ptosis None  III,IV,VI: extraocular muscles  Full ROM  V: mastication Normal  V: facial light touch sensation  Normal  V,VII: corneal reflex  Present  VII: facial muscle function - upper  Normal  VII: facial muscle function - lower Normal  VIII: hearing Not tested  IX: soft palate elevation  Normal  IX,X: gag reflex Present  XI: trapezius strength  5/5  XI: sternocleidomastoid strength 5/5  XI: neck flexion strength  5/5  XII: tongue strength  Normal    Data Review Lab Results  Component Value Date   WBC 16.3 (H) 10/09/2023   HGB 16.7 10/09/2023   HCT 49.0 10/09/2023   MCV 90.7 10/09/2023   PLT 384 10/09/2023   Lab Results  Component Value Date   NA 138 10/09/2023   K 4.6 10/09/2023   CL 103 10/09/2023   CO2 25 10/09/2023   BUN <5 (L) 10/09/2023   CREATININE 0.90 10/09/2023   GLUCOSE 87 10/09/2023   No results found for: INR, PROTIME  Assessment/Plan: Cervical pseudoarthrosis, cervicalgia: I have discussed the situation with the patient.  We have discussed the various treatment options including surgery.  I have described the surgical treatment option of a posterior C3-4, C4-5, C5-6 and C6-7 instrumentation and fusion.  I have shown him surgical models.  I have given him a surgical pamphlet.  We  have discussed the risk, benefits, alternatives, expected postoperative course, and likelihood of achieving our goals with surgery.  I have answered all the patient's questions.  He has decided to proceed with surgery.   Reyes JONETTA Budge 10/12/2023 9:44 AM

## 2023-10-12 NOTE — Anesthesia Procedure Notes (Signed)
 Procedure Name: Intubation Date/Time: 10/12/2023 11:42 AM  Performed by: Lamar Lucie DASEN, CRNAPre-anesthesia Checklist: Patient identified, Emergency Drugs available, Suction available and Patient being monitored Patient Re-evaluated:Patient Re-evaluated prior to induction Oxygen Delivery Method: Circle system utilized Preoxygenation: Pre-oxygenation with 100% oxygen Induction Type: IV induction Ventilation: Mask ventilation without difficulty and Oral airway inserted - appropriate to patient size Laryngoscope Size: Glidescope and 4 Grade View: Grade I Tube type: Oral Tube size: 7.5 mm Number of attempts: 1 Airway Equipment and Method: Stylet and Oral airway Placement Confirmation: ETT inserted through vocal cords under direct vision, positive ETCO2 and breath sounds checked- equal and bilateral Secured at: 23 cm Tube secured with: Tape Dental Injury: Teeth and Oropharynx as per pre-operative assessment  Comments: Head and neck maintained in neutral spine for intubation. Glidescope chosen for nature of procedure

## 2023-10-12 NOTE — Anesthesia Postprocedure Evaluation (Signed)
 Anesthesia Post Note  Patient: TIRAS BIANCHINI  Procedure(s) Performed: POSTERIOR CERVICAL FUSION/FORAMINOTOMY LEVEL CERVICAL THREE-CERVICAL SEVEN (Spine Cervical)     Patient location during evaluation: PACU Anesthesia Type: General Level of consciousness: awake Pain management: pain level controlled Vital Signs Assessment: post-procedure vital signs reviewed and stable Respiratory status: spontaneous breathing, nonlabored ventilation and respiratory function stable Cardiovascular status: blood pressure returned to baseline and stable Postop Assessment: no apparent nausea or vomiting Anesthetic complications: no   No notable events documented.  Last Vitals:  Vitals:   10/12/23 1600 10/12/23 1615  BP: 116/77 (!) 131/99  Pulse: 70 75  Resp: 10 10  Temp:    SpO2: 98% 100%    Last Pain:  Vitals:   10/12/23 1600  TempSrc:   PainSc: Asleep    LLE Motor Response: Purposeful movement (10/12/23 1615)   RLE Motor Response: Purposeful movement (10/12/23 1615)        Delon Aisha Arch

## 2023-10-12 NOTE — Op Note (Signed)
 Brief history: The patient is a 43 year old white male whose had previous anterior cervical discectomy fusions and plating at C3-4, C4-5, C5-6 and C6-7.  He has had persistent and worsening neck pain.  He was worked up with a cervical CT which demonstrated findings consistent with a cervical pseudoarthrosis.  I discussed the various treatment options with him.  He has decided to proceed with surgery.  Preop diagnosis: Cervical pseudoarthrosis, cervicalgia  Postop diagnosis: The same  Procedure: Posterior cervical arthrodesis C3-4, C4-5, C5-6 and C6-7 with local morselized autograft bone and intra grow bone graft extender; posterior cervical instrumentation C3-C7 bilaterally with Zimmer titanium lateral mass screws and rods  Surgeon: Dr. Chyrl Budge  Assistant: Duwaine Beck  Anesthesia: General Tracheal  Estimated blood loss: 100 cc  Specimens: None  Drains: Medium Hemovac drain  Complications: None  Description of procedure: The patient was brought to the operating room by the anesthesia team.  General endotracheal anesthesia was induced.  I applied the [place] 3 point headrest to his calvarium.  He was carefully to turn to the prone position on the chest rolls.  His cervical occipital region was then shaved with clippers this region as well as his posterior cervical and upper thoracic region was prepared with Betadine scrub and Betadine solution.  Sterile drapes were applied.  I injected the area to be incised with Marcaine  with epinephrine  solution.  I then used a scalpel to make a linear midline incision at the cervical thoracic junction.  I used electrocautery to perform a bilateral subperiosteal dissection exposing the spinous process and lamina from C3-T1.  I inserted the McCullough retractor for exposure.  We obtained intraoperative radiograph.  I used electrocautery to expose the bilateral lateral masses at C3, C4, C5, C6 and C7.  I then used the 14 mm drill to drill hole in the  lateral masses aiming cephalad laterally using intraoperative fluoroscopy with the upper screws..  I probed inside the holes to rule out cortical breaches.  I tapped the holes and inserted 14 x 3.5 mm titanium polyaxial screws into the lateral masses bilaterally at C3, C4, C5, C6 and C7.  We got good bony purchase.  We then connected the unilateral screws with a rod.  We secured the rod in place with the caps which we heighten appropriately completing the instrumentation from C3-C7 bilaterally.  We now turned our attention to the posterior lateral thesis at C3-4, C4-5, C5-6 and C6-7.  I used a high-speed drill to decorticate the lateral lamina and lateral masses bilaterally at C3-4, C4-5, C5-6 and C6-7.  We then laid a combination of local autograft bone we obtained on a drawing as well as intra grow bone graft extender over these decorticated structures completing the posterolateral thesis at C3-4, C4-5, C5-6 and C6-7.  We then obtained hemostasis using electrocautery.  We irrigated the wound out with saline solution.  We injected Exparel .  We removed the retractor.  We then placed a Hemovac drain and tunneled through a separate stab wound.  We then reapproximated the patient's cervical thoracic fascia with interrupted 0 Vicryl suture.  We reapproximated the subcutaneous tissue with interrupted 2-0 Vicryl suture.  We reapproximated the skin with with Steri-Strips and benzoin.  The wound was then coated with bacitracin  ointment.  A sterile dressing was applied.  The drapes were removed.  By report all sponge, instrument, and needle counts were correct.  We then returned the patient to the supine position.  I removed the Mayfield 3 point headrest from  the calvarium.

## 2023-10-13 MED ORDER — CYCLOBENZAPRINE HCL 10 MG PO TABS: 10.0000 mg | ORAL_TABLET | Freq: Three times a day (TID) | ORAL | 0 refills | Status: AC | PRN

## 2023-10-13 MED ORDER — DOCUSATE SODIUM 100 MG PO CAPS: 100.0000 mg | ORAL_CAPSULE | Freq: Two times a day (BID) | ORAL | 0 refills | Status: AC

## 2023-10-13 MED ORDER — OXYCODONE-ACETAMINOPHEN 5-325 MG PO TABS: 1.0000 | ORAL_TABLET | ORAL | Status: DC | PRN

## 2023-10-13 MED ORDER — OXYCODONE-ACETAMINOPHEN 5-325 MG PO TABS: 1.0000 | ORAL_TABLET | ORAL | 0 refills | Status: AC | PRN

## 2023-10-13 NOTE — Evaluation (Signed)
 Occupational Therapy Evaluation Patient Details Name: Taylor Coffey MRN: [id number] DOB: September 10, 1980 Today's Date: 10/13/2023   History of Present Illness   43 yo male s/p 9/23 posterior cervical atrhodesis C3-4 C4-5 C5-6 C6-7 with local morselized autograft bone and lateral mass screws and rods PMH anxiety, arthritis, CPOD, depression, kidney stones, ACDF 2017,2021     Clinical Impressions Patient evaluated by Occupational Therapy with no further acute OT needs identified. All education has been completed and the patient has no further questions. See below for any follow-up Occupational Therapy or equipment needs. OT to sign off. Thank you for referral.       If plan is discharge home, recommend the following:         Functional Status Assessment   Patient has had a recent decline in their functional status and demonstrates the ability to make significant improvements in function in a reasonable and predictable amount of time.     Equipment Recommendations   None recommended by OT     Recommendations for Other Services         Precautions/Restrictions   Precautions Precautions: Cervical Recall of Precautions/Restrictions: Intact Required Braces or Orthoses: Cervical Brace (handout provided) Cervical Brace: Hard collar;Other (comment) (up moving) Restrictions Weight Bearing Restrictions Per Provider Order: No     Mobility Bed Mobility Overal bed mobility: Independent                  Transfers Overall transfer level: Independent                        Balance Overall balance assessment: Independent                                         ADL either performed or assessed with clinical judgement   ADL Overall ADL's : Modified independent                                       General ADL Comments: able to complete LB dressing, bathroom transfers, don doff brace  Cervical precautions ( handout  provided): Educated patient on don doff brace with return demonstration, educated on oral care using cups, washing face with cloth, never to wash directly on incision site, avoid neck rotation flexion and extension, positioning with pillows in chair for bil UE, sleeping positioning, avoiding pushing / pulling with bil UE. Pt with brace off on arrival and OT helping patient put on brace. Pt reports wanting to leave today if possible. Rn notified OT session complete and MD rounded just prior to session.   Vision Baseline Vision/History: 0 No visual deficits Patient Visual Report: No change from baseline       Perception         Praxis         Pertinent Vitals/Pain Pain Assessment Pain Assessment: Faces Faces Pain Scale: Hurts a little bit Pain Location: neck Pain Descriptors / Indicators: Operative site guarding Pain Intervention(s): Monitored during session, Premedicated before session, Repositioned     Extremity/Trunk Assessment Upper Extremity Assessment Upper Extremity Assessment: Overall WFL for tasks assessed (reports no sensation changes at present. pt reports numbness from the morning had stopped)   Lower Extremity Assessment Lower Extremity Assessment: Overall WFL for tasks assessed   Cervical / Trunk Assessment  Cervical / Trunk Assessment: Neck Surgery   Communication Communication Communication: No apparent difficulties   Cognition Arousal: Alert Behavior During Therapy: WFL for tasks assessed/performed Cognition: No apparent impairments                               Following commands: Intact       Cueing  General Comments          Exercises     Shoulder Instructions      Home Living Family/patient expects to be discharged to:: Private residence Living Arrangements: Spouse/significant other;Children;Other (Comment) (sister niece daughter wife) Available Help at Discharge: Family Type of Home: House Home Access: Level entry      Home Layout: One level     Bathroom Shower/Tub: Walk-in Human resources officer: Standard     Home Equipment: [place] - single point   Additional Comments: 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      Prior Functioning/Environment Prior Level of Function : Independent/Modified Independent;Working/employed;Driving                    OT Problem List:     OT Treatment/Interventions:        OT Goals(Current goals can be found in the care plan section)   Acute Rehab OT Goals Potential to Achieve Goals: Good   OT Frequency:       Co-evaluation              AM-PAC OT 6 Clicks Daily Activity     Outcome Measure Help from another person eating meals?: None Help from another person taking care of personal grooming?: None Help from another person toileting, which includes using toliet, bedpan, or urinal?: None Help from another person bathing (including washing, rinsing, drying)?: None Help from another person to put on and taking off regular upper body clothing?: None Help from another person to put on and taking off regular lower body clothing?: None 6 Click Score: 24   End of Session Equipment Utilized During Treatment: Cervical collar Nurse Communication: Mobility status;Precautions  Activity Tolerance: Patient tolerated treatment well Patient left: in bed;with call bell/phone within reach;with family/visitor present                   Time: 0720-0740 OT Time Calculation (min): 20 min Charges:  OT General Charges $OT Visit: 1 Visit OT Evaluation $OT Eval Moderate Complexity: 1 Mod   Taylor Coffey, OTR/L  Acute Rehabilitation Services Office: [phone] .   Taylor Coffey 10/13/2023, 10:32 AM

## 2023-10-13 NOTE — Discharge Summary (Addendum)
 Physician Discharge Summary  Patient ID: Taylor Coffey MRN: [id number] DOB/AGE: 45/03/1980 45 y.o.  Admit date: 10/12/2023 Discharge date: 10/13/2023  Admission Diagnoses: Cervical pseudoarthrosis, cervicalgia  Discharge Diagnoses: The same Principal Problem:   Cervical pseudoarthrosis [facility])   Discharged Condition: good  Hospital Course: I performed a posterior cervical instrumentation and fusion on the patient on 10/12/2023.  The surgery went well.  The patient's postoperative course was unremarkable.  On postoperative day #1 the patient requested discharge to home.  The patient was given verbal and written discharge instructions.  All his questions were answered.  I instructed him to quit smoking.  Consults: PT, care management Significant Diagnostic Studies: None Treatments: C3-4, C4-5, C5-6 and C6-7 posterior instrumentation and fusion Discharge Exam: Blood pressure 121/85, pulse 82, temperature 97.8 F (36.6 C), temperature source Oral, resp. rate 20, height 5' 5 (1.651 m), weight 99.8 kg, SpO2 93%. The patient is alert and pleasant.  His strength is normal.  His dressing is clean and dry.  Disposition: Home  Discharge Instructions     Call MD for:  difficulty breathing, headache or visual disturbances   Complete by: As directed    Call MD for:  extreme fatigue   Complete by: As directed    Call MD for:  hives   Complete by: As directed    Call MD for:  persistant dizziness or light-headedness   Complete by: As directed    Call MD for:  persistant nausea and vomiting   Complete by: As directed    Call MD for:  redness, tenderness, or signs of infection (pain, swelling, redness, odor or green/yellow discharge around incision site)   Complete by: As directed    Call MD for:  severe uncontrolled pain   Complete by: As directed    Call MD for:  temperature >100.4   Complete by: As directed    Diet - low sodium heart healthy   Complete by: As directed    Discharge  instructions   Complete by: As directed    Call [phone] for a followup appointment. Take a stool softener while you are using pain medications.   Driving Restrictions   Complete by: As directed    Do not drive for 2 weeks.   Increase activity slowly   Complete by: As directed    Lifting restrictions   Complete by: As directed    Do not lift more than 5 pounds. No excessive bending or twisting.   May shower / Bathe   Complete by: As directed    Remove the dressing for 3 days after surgery.  You may shower, but leave the incision alone.   Remove dressing in 48 hours   Complete by: As directed       Allergies as of 10/13/2023       Reactions   Vicodin [hydrocodone-acetaminophen ] Itching   Opioid-induced pruritus   Amoxicillin Itching, Rash        Medication List     STOP taking these medications    oxyCODONE -acetaminophen  7.5-325 MG tablet Commonly known as: PERCOCET Replaced by: oxyCODONE -acetaminophen  5-325 MG tablet       TAKE these medications    albuterol  108 (90 Base) MCG/ACT inhaler Commonly known as: VENTOLIN  HFA Inhale 2 puffs into the lungs every 6 (six) hours as needed for wheezing or shortness of breath.   cyclobenzaprine  10 MG tablet Commonly known as: FLEXERIL  Take 1 tablet (10 mg total) by mouth 3 (three) times daily as needed for muscle  spasms. What changed:  how much to take when to take this   docusate sodium  100 MG capsule Commonly known as: COLACE Take 1 capsule (100 mg total) by mouth 2 (two) times daily.   gabapentin  400 MG capsule Commonly known as: NEURONTIN  Take 800 mg by mouth 3 (three) times daily.   oxyCODONE -acetaminophen  5-325 MG tablet Commonly known as: PERCOCET/ROXICET Take 1-2 tablets by mouth every 4 (four) hours as needed for moderate pain (pain score 4-6). Replaces: oxyCODONE -acetaminophen  7.5-325 MG tablet   pantoprazole  40 MG tablet Commonly known as: PROTONIX  Take 40 mg by mouth daily as needed (heartburn,  indigestion).   Trelegy Ellipta 200-62.5-25 MCG/ACT Aepb Generic drug: Fluticasone-Umeclidin-Vilant Inhale 1 puff into the lungs daily.         Signed: Reyes JONETTA Budge 10/13/2023, 7:37 AM

## 2023-10-13 NOTE — Progress Notes (Signed)
 Patient alert and oriented, voided, ambulated. Surgical site clean and dry no sign of infection. D/c instructions explain and given to the patient and family, all questions answered.

## 2023-10-15 ENCOUNTER — Encounter (HOSPITAL_COMMUNITY): Payer: Self-pay | Admitting: Neurosurgery

## 2023-10-23 DIAGNOSIS — Z981 Arthrodesis status: Secondary | ICD-10-CM | POA: Diagnosis not present

## 2023-10-23 DIAGNOSIS — M4322 Fusion of spine, cervical region: Secondary | ICD-10-CM | POA: Diagnosis not present

## 2023-11-03 DIAGNOSIS — S129XXS Fracture of neck, unspecified, sequela: Secondary | ICD-10-CM | POA: Diagnosis not present

## 2023-11-03 DIAGNOSIS — Z6835 Body mass index (BMI) 35.0-35.9, adult: Secondary | ICD-10-CM | POA: Diagnosis not present

## 2023-11-03 DIAGNOSIS — M542 Cervicalgia: Secondary | ICD-10-CM | POA: Diagnosis not present

## 2023-11-12 DIAGNOSIS — E669 Obesity, unspecified: Secondary | ICD-10-CM | POA: Diagnosis not present

## 2023-11-12 DIAGNOSIS — Z0000 Encounter for general adult medical examination without abnormal findings: Secondary | ICD-10-CM | POA: Diagnosis not present

## 2023-11-12 DIAGNOSIS — Z2821 Immunization not carried out because of patient refusal: Secondary | ICD-10-CM | POA: Diagnosis not present

## 2023-11-12 DIAGNOSIS — Z6834 Body mass index (BMI) 34.0-34.9, adult: Secondary | ICD-10-CM | POA: Diagnosis not present

## 2023-11-12 DIAGNOSIS — Z008 Encounter for other general examination: Secondary | ICD-10-CM | POA: Diagnosis not present

## 2023-11-19 DIAGNOSIS — M542 Cervicalgia: Secondary | ICD-10-CM | POA: Diagnosis not present

## 2023-11-19 DIAGNOSIS — F1120 Opioid dependence, uncomplicated: Secondary | ICD-10-CM | POA: Diagnosis not present

## 2023-12-03 DIAGNOSIS — Z6833 Body mass index (BMI) 33.0-33.9, adult: Secondary | ICD-10-CM | POA: Diagnosis not present

## 2023-12-03 DIAGNOSIS — K2100 Gastro-esophageal reflux disease with esophagitis, without bleeding: Secondary | ICD-10-CM | POA: Diagnosis not present

## 2023-12-03 DIAGNOSIS — M542 Cervicalgia: Secondary | ICD-10-CM | POA: Diagnosis not present

## 2023-12-03 DIAGNOSIS — E782 Mixed hyperlipidemia: Secondary | ICD-10-CM | POA: Diagnosis not present

## 2023-12-03 DIAGNOSIS — J449 Chronic obstructive pulmonary disease, unspecified: Secondary | ICD-10-CM | POA: Diagnosis not present

## 2023-12-03 DIAGNOSIS — E669 Obesity, unspecified: Secondary | ICD-10-CM | POA: Diagnosis not present

## 2023-12-07 DIAGNOSIS — E785 Hyperlipidemia, unspecified: Secondary | ICD-10-CM | POA: Diagnosis not present

## 2023-12-07 DIAGNOSIS — F1120 Opioid dependence, uncomplicated: Secondary | ICD-10-CM | POA: Diagnosis not present

## 2023-12-07 DIAGNOSIS — J449 Chronic obstructive pulmonary disease, unspecified: Secondary | ICD-10-CM | POA: Diagnosis not present

## 2023-12-07 DIAGNOSIS — K219 Gastro-esophageal reflux disease without esophagitis: Secondary | ICD-10-CM | POA: Diagnosis not present

## 2023-12-07 DIAGNOSIS — F17210 Nicotine dependence, cigarettes, uncomplicated: Secondary | ICD-10-CM | POA: Diagnosis not present

## 2023-12-07 DIAGNOSIS — G959 Disease of spinal cord, unspecified: Secondary | ICD-10-CM | POA: Diagnosis not present

## 2023-12-07 DIAGNOSIS — F324 Major depressive disorder, single episode, in partial remission: Secondary | ICD-10-CM | POA: Diagnosis not present

## 2023-12-07 DIAGNOSIS — I700 Atherosclerosis of aorta: Secondary | ICD-10-CM | POA: Diagnosis not present

## 2023-12-07 DIAGNOSIS — Z6835 Body mass index (BMI) 35.0-35.9, adult: Secondary | ICD-10-CM | POA: Diagnosis not present

## 2023-12-07 DIAGNOSIS — G629 Polyneuropathy, unspecified: Secondary | ICD-10-CM | POA: Diagnosis not present

## 2023-12-07 DIAGNOSIS — F419 Anxiety disorder, unspecified: Secondary | ICD-10-CM | POA: Diagnosis not present

## 2024-01-12 ENCOUNTER — Ambulatory Visit

## 2024-01-12 VITALS — Ht 65.0 in | Wt 218.0 lb

## 2024-01-12 DIAGNOSIS — Z1211 Encounter for screening for malignant neoplasm of colon: Secondary | ICD-10-CM

## 2024-01-12 MED ORDER — NA SULFATE-K SULFATE-MG SULF 17.5-3.13-1.6 GM/177ML PO SOLN: 1.0000 | Freq: Once | ORAL | 0 refills | Status: AC

## 2024-01-12 NOTE — Progress Notes (Signed)
 PCP MD at time of PV: Sharlet Speaks, MD __________________________________________________________________________________________________________________________________________  No egg allergy known to patient  No soy allergy known to patient No issues known to pt with past sedation with any surgeries or procedures Patient denies ever being told they had issues or difficulty with intubation  No FH of Malignant Hyperthermia Pt is not on diet pills Pt is not on  home 02  Pt is not on blood thinners  No A fib or A flutter Have any cardiac testing pending--no  LOA: independent  No Chew or Snuff tobacco __________________________________________________________________________________________________________________________________________  Constipation: yes drug induced from pain medication  Prep: suprep  Family hx unknown __________________________________________________________________________________________________________________________________________  PV completed with patient. Prep instructions reviewed and provided during apt. Rx sent to preferred pharmacy.  __________________________________________________________________________________________________________________________________________  Patient's chart reviewed by Norleen Schillings CNRA prior to previsit and patient appropriate for the [facility].  Previsit completed and red dot placed by patient's name on their procedure day (on provider's schedule).

## 2024-01-25 ENCOUNTER — Encounter: Payer: Self-pay | Admitting: Internal Medicine

## 2024-01-26 ENCOUNTER — Encounter: Payer: Self-pay | Admitting: Internal Medicine

## 2024-01-26 ENCOUNTER — Ambulatory Visit: Admitting: Internal Medicine

## 2024-01-26 VITALS — BP 136/84 | HR 98 | Temp 98.1°F | Ht 65.0 in | Wt 215.0 lb

## 2024-01-26 DIAGNOSIS — Z1211 Encounter for screening for malignant neoplasm of colon: Secondary | ICD-10-CM

## 2024-01-26 MED ORDER — NA SULFATE-K SULFATE-MG SULF 17.5-3.13-1.6 GM/177ML PO SOLN: 1.0000 | Freq: Once | ORAL | 0 refills | Status: AC

## 2024-01-26 MED ORDER — FLEET ENEMA RE ENEM
1.0000 | ENEMA | Freq: Once | RECTAL | Status: AC
  Administered 2024-01-26: 1 via RECTAL

## 2024-01-26 MED ORDER — SODIUM CHLORIDE 0.9 % IV SOLN: 500.0000 mL | Freq: Once | INTRAVENOUS | Status: AC

## 2024-01-26 NOTE — Progress Notes (Signed)
 Patient was not adequately prepped. 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 Will have him be rescheduled.

## 2024-01-26 NOTE — Progress Notes (Signed)
 Pt's states no medical or surgical changes since previsit or office visit.    Patient states last BM was liquid but so cloudy he cannot see through. No solid pieces per patient. Dr. Federico made aware and enema ordered.   After enema, stool was liquid and brown, unable to see through with some solid pieces. Dr. Federico notified and advised to reschedule. Explained this to patient and verbalized understanding and agreed to reschedule procedure.

## 2024-01-26 NOTE — Progress Notes (Signed)
 Bowel prep not adequate. Pt rescheduled for 02/08/24 at 2:30. Prep instructions printed and reviewed with pt for a 2 day prep. Prescription sent to pt's preferred pharmacy. Pt verbalized understanding.

## 2024-02-08 ENCOUNTER — Encounter: Admitting: Internal Medicine

## 2024-02-08 ENCOUNTER — Telehealth: Payer: Self-pay | Admitting: Internal Medicine

## 2024-02-08 NOTE — Telephone Encounter (Signed)
 Patient had procedure this afternoon with Dr. Federico.  He was called twice, left voicemails both times, and will be no showed off the schedule.  [id number].
